# Patient Record
Sex: Female | Born: 1965 | Race: White | Hispanic: No | Marital: Married | Smoking: Never smoker
Health system: Southern US, Community
[De-identification: ages and names within clinical notes are randomized; demographics above are authoritative.]

## PROBLEM LIST (undated history)

## (undated) DIAGNOSIS — R7303 Prediabetes: Secondary | ICD-10-CM

## (undated) DIAGNOSIS — G479 Sleep disorder, unspecified: Secondary | ICD-10-CM

## (undated) DIAGNOSIS — M549 Dorsalgia, unspecified: Secondary | ICD-10-CM

## (undated) DIAGNOSIS — J45909 Unspecified asthma, uncomplicated: Secondary | ICD-10-CM

## (undated) DIAGNOSIS — R059 Cough, unspecified: Secondary | ICD-10-CM

## (undated) DIAGNOSIS — I10 Essential (primary) hypertension: Secondary | ICD-10-CM

## (undated) DIAGNOSIS — R200 Anesthesia of skin: Secondary | ICD-10-CM

## (undated) DIAGNOSIS — E538 Deficiency of other specified B group vitamins: Secondary | ICD-10-CM

## (undated) DIAGNOSIS — I011 Acute rheumatic endocarditis: Secondary | ICD-10-CM

## (undated) DIAGNOSIS — M2550 Pain in unspecified joint: Secondary | ICD-10-CM

## (undated) DIAGNOSIS — R0602 Shortness of breath: Secondary | ICD-10-CM

## (undated) DIAGNOSIS — K635 Polyp of colon: Secondary | ICD-10-CM

## (undated) DIAGNOSIS — M1990 Unspecified osteoarthritis, unspecified site: Secondary | ICD-10-CM

## (undated) DIAGNOSIS — R05 Cough: Secondary | ICD-10-CM

## (undated) HISTORY — DX: Deficiency of other specified B group vitamins: E53.8

## (undated) HISTORY — DX: Cough, unspecified: R05.9

## (undated) HISTORY — DX: Anesthesia of skin: R20.0

## (undated) HISTORY — PX: TONSILLECTOMY: SUR1361

## (undated) HISTORY — PX: OTHER SURGICAL HISTORY: SHX169

## (undated) HISTORY — PX: COLONOSCOPY: SHX174

## (undated) HISTORY — DX: Essential (primary) hypertension: I10

## (undated) HISTORY — DX: Pain in unspecified joint: M25.50

## (undated) HISTORY — DX: Prediabetes: R73.03

## (undated) HISTORY — DX: Sleep disorder, unspecified: G47.9

## (undated) HISTORY — DX: Unspecified osteoarthritis, unspecified site: M19.90

## (undated) HISTORY — DX: Cough: R05

## (undated) HISTORY — DX: Unspecified asthma, uncomplicated: J45.909

## (undated) HISTORY — PX: DILATION AND CURETTAGE OF UTERUS: SHX78

## (undated) HISTORY — DX: Dorsalgia, unspecified: M54.9

## (undated) HISTORY — DX: Shortness of breath: R06.02

## (undated) HISTORY — DX: Polyp of colon: K63.5

## (undated) HISTORY — PX: QUADRICEPS REPAIR: SHX2281

## (undated) HISTORY — DX: Acute rheumatic endocarditis: I01.1

---

## 2011-08-25 DIAGNOSIS — E559 Vitamin D deficiency, unspecified: Secondary | ICD-10-CM | POA: Insufficient documentation

## 2011-08-25 DIAGNOSIS — R05 Cough: Secondary | ICD-10-CM | POA: Insufficient documentation

## 2011-08-25 DIAGNOSIS — J45909 Unspecified asthma, uncomplicated: Secondary | ICD-10-CM | POA: Insufficient documentation

## 2012-11-11 DIAGNOSIS — Z8679 Personal history of other diseases of the circulatory system: Secondary | ICD-10-CM | POA: Insufficient documentation

## 2015-02-01 DIAGNOSIS — R0600 Dyspnea, unspecified: Secondary | ICD-10-CM | POA: Insufficient documentation

## 2016-11-16 ENCOUNTER — Ambulatory Visit: Payer: BC Managed Care – PPO | Admitting: Family

## 2016-12-07 ENCOUNTER — Encounter: Payer: Self-pay | Admitting: Family

## 2016-12-07 ENCOUNTER — Ambulatory Visit (INDEPENDENT_AMBULATORY_CARE_PROVIDER_SITE_OTHER): Payer: BC Managed Care – PPO | Admitting: Family

## 2016-12-07 VITALS — BP 138/76 | HR 68 | Temp 98.6°F | Ht 65.0 in | Wt 283.0 lb

## 2016-12-07 DIAGNOSIS — G5602 Carpal tunnel syndrome, left upper limb: Secondary | ICD-10-CM | POA: Diagnosis not present

## 2016-12-07 DIAGNOSIS — Z0000 Encounter for general adult medical examination without abnormal findings: Secondary | ICD-10-CM

## 2016-12-07 DIAGNOSIS — M1990 Unspecified osteoarthritis, unspecified site: Secondary | ICD-10-CM

## 2016-12-07 NOTE — Patient Instructions (Addendum)
Ice for arthritis  Brace for suspect carpal tunnel.  Let's treat conservatively as we discussed.   Over-the-counter medications you may try for arthritic pain include:   ThermaCare patches   Capsaicin cream   Icy hot   If conservative treatment doesn't yield results, we will consider physical therapy, consult to Sports Medicine/Orthopedics for further evaluation, and imaging.     3d Mammogram  We placed a referral. Mammogram this year. I asked that you call one the below locations and schedule this when it is convenient for you.   If you have dense breasts, you may ask for 3D mammogram over the traditional 2D mammogram as new evidence suggest 3D is superior. Please note that NOT all insurance companies cover 3D and you may have to pay a higher copay. You may call your insurance company to further clarify your benefits.   Options for [facility]  [place], [place]  * [facility] [place] [place] * Note if you ask for 3D mammogram at this location, you must request [place], [place] location*

## 2016-12-07 NOTE — Assessment & Plan Note (Signed)
Pending medical records from prior physician. Patient will follow-up for physical, Pap smear . she understands the schedule mammogram

## 2016-12-07 NOTE — Progress Notes (Signed)
Subjective:    Patient ID: Marie Solis, female    DOB: 1965-08-09, 51 y.o.   MRN: [id number]  CC: Marie Solis is a 51 y.o. female who presents today to establish care.    HPI: Came from Dr Wynetta Emery   Asthma- no longer on symbicort; needed about a year ago for cough, sob when cleaning out an old house. NO wheezing, sob, CP.   Joint pains- both fingers and hands for year, unchanged. Also notes pain in her feet and knees;  has made effort to loose weight ( lost 40 lbs), which has improved with better shoes, weight loss. NO joint swelling, fever, falls, injury.   Notes had 'pinched nerve' in neck years ago, reports left thumb, 1st and 2nd finger  Have numbness,tingling. Have painting this week which may have aggravated. Exercises, stretches, naproxen help pain  NO recent imaging of neck.   No h/o CVA, malignancy.   NO h/o ckd.           HISTORY:  Past Medical History:  Diagnosis Date  . Arthritis   . Asthma   . Colon polyps   . Hypertension    No past surgical history on file. No family history on file.  Allergies: Patient has no allergy information on record. No current outpatient prescriptions on file prior to visit.   No current facility-administered medications on file prior to visit.     Social History  Substance Use Topics  . Smoking status: Never Smoker  . Smokeless tobacco: Never Used  . Alcohol use No    Review of Systems  Constitutional: Negative for chills and fever.  Respiratory: Negative for cough.   Cardiovascular: Negative for chest pain and palpitations.  Gastrointestinal: Negative for nausea and vomiting.  Musculoskeletal: Positive for arthralgias. Negative for back pain, neck pain and neck stiffness.  Neurological: Positive for numbness.      Objective:    BP 138/76   Pulse 68   Temp 98.6 F (37 C) (Oral)   Ht 5\' 5"  (1.651 m)   Wt 283 lb (128.4 kg)   SpO2 97%   BMI 47.09 kg/m  BP Readings from Last 3 Encounters:  12/07/16  138/76   Wt Readings from Last 3 Encounters:  12/07/16 283 lb (128.4 kg)    Physical Exam  Constitutional: She appears well-developed and well-nourished.  Eyes: Conjunctivae are normal.  Neck: Normal range of motion and full passive range of motion without pain. Neck supple. No spinous process tenderness and no muscular tenderness present. No neck rigidity. Erythema present. No edema and normal range of motion present.  Negative spurlings    Cardiovascular: Normal rate, regular rhythm, normal heart sounds and normal pulses.   Pulmonary/Chest: Effort normal and breath sounds normal. She has no wheezes. She has no rhonchi. She has no rales.  Neurological: She is alert.  Grip strength normal.  Sensation intact bilateral hands.  Negative tinels, phalens  Skin: Skin is warm and dry.  Psychiatric: She has a normal mood and affect. Her speech is normal and behavior is normal. Thought content normal.  Vitals reviewed.      Assessment & Plan:   Problem List Items Addressed This Visit      Nervous and Auditory   Carpal tunnel syndrome of left wrist    Symptoms most consistent with carpal tunnel although unable to elicit on exam. Discussed conservative treatment;  advised patient to purchase carpal tunnel brace. She politely declines any referral for neurology for formal testing.  She will let me know if worsens.        Musculoskeletal and Integument   Osteoarthritis    Diffuse. Long discussion with patient about importance of exercise and how to use anti-inflammatories whether oral or topical.  Also advised her to start icing regimen. She  declined referral to orthopedics at this time and we will continue to monitor        Other   Encounter for medical examination to establish care - Primary    Pending medical records from prior physician. Patient will follow-up for physical, Pap smear . she understands the schedule mammogram      Relevant Orders   Ambulatory referral to  Dermatology   MM SCREENING BREAST TOMO BILATERAL   Ambulatory referral to Gastroenterology   Referral to Nutrition and Diabetes Services       I am having Ms. Schweers maintain her SYMBICORT.   Meds ordered this encounter  Medications  . SYMBICORT 80-4.5 MCG/ACT inhaler    Return precautions given.   Risks, benefits, and alternatives of the medications and treatment plan prescribed today were discussed, and patient expressed understanding.   Education regarding symptom management and diagnosis given to patient on AVS.  Continue to follow with Burnard Hawthorne, FNP for routine health maintenance.   Stephanie Acre and I agreed with plan.   [name], FNP

## 2016-12-07 NOTE — Progress Notes (Signed)
Pre visit review using our clinic review tool, if applicable. No additional management support is needed unless otherwise documented below in the visit note. 

## 2016-12-07 NOTE — Assessment & Plan Note (Addendum)
Symptoms most consistent with carpal tunnel although unable to elicit on exam. Discussed conservative treatment;  advised patient to purchase carpal tunnel brace. She politely declines any referral for neurology for formal testing. She will let me know if worsens.

## 2016-12-07 NOTE — Assessment & Plan Note (Addendum)
Diffuse. Long discussion with patient about importance of exercise and how to use anti-inflammatories whether oral or topical.  Also advised her to start icing regimen. She  declined referral to orthopedics at this time and we will continue to monitor

## 2016-12-16 ENCOUNTER — Encounter: Payer: Self-pay | Admitting: Family

## 2016-12-16 ENCOUNTER — Telehealth: Payer: Self-pay | Admitting: Family

## 2016-12-16 NOTE — Telephone Encounter (Signed)
Pt was notified via mail regarding mammo appt.

## 2016-12-23 ENCOUNTER — Telehealth: Payer: Self-pay

## 2016-12-23 ENCOUNTER — Other Ambulatory Visit: Payer: Self-pay

## 2016-12-23 DIAGNOSIS — Z1211 Encounter for screening for malignant neoplasm of colon: Secondary | ICD-10-CM

## 2016-12-23 NOTE — Telephone Encounter (Signed)
Gastroenterology Pre-Procedure Review  Request Date: 01/19/17 Requesting Physician: Dr. Allen Norris  PATIENT REVIEW QUESTIONS: The patient responded to the following health history questions as indicated:    1. Are you having any GI issues? no 2. Do you have a personal history of Polyps? no 3. Do you have a family history of Colon Cancer or Polyps? no 4. Diabetes Mellitus? no 5. Joint replacements in the past 12 months?no 6. Major health problems in the past 3 months?no 7. Any artificial heart valves, MVP, or defibrillator?no    MEDICATIONS & ALLERGIES:    Patient reports the following regarding taking any anticoagulation/antiplatelet therapy:   Plavix, Coumadin, Eliquis, Xarelto, Lovenox, Pradaxa, Brilinta, or Effient? no Aspirin? no  Patient confirms/reports the following medications:  Current Outpatient Prescriptions  Medication Sig Dispense Refill  . SYMBICORT 80-4.5 MCG/ACT inhaler      No current facility-administered medications for this visit.     Patient confirms/reports the following allergies:  No Known Allergies  No orders of the defined types were placed in this encounter.   AUTHORIZATION INFORMATION Primary Insurance: 1D#: Group #:  Secondary Insurance: 1D#: Group #:  SCHEDULE INFORMATION: Date: 01/19/17 Time: Location:[facility]

## 2016-12-24 ENCOUNTER — Encounter: Payer: BC Managed Care – PPO | Admitting: Family

## 2016-12-28 ENCOUNTER — Encounter: Payer: BC Managed Care – PPO | Admitting: Family

## 2016-12-29 ENCOUNTER — Encounter: Payer: Self-pay | Admitting: Family

## 2016-12-29 ENCOUNTER — Other Ambulatory Visit (HOSPITAL_COMMUNITY)
Admission: RE | Admit: 2016-12-29 | Discharge: 2016-12-29 | Disposition: A | Discharge status: Home / Self Care | Payer: BC Managed Care – PPO | Source: Ambulatory Visit | Attending: Family | Admitting: Family

## 2016-12-29 ENCOUNTER — Ambulatory Visit (INDEPENDENT_AMBULATORY_CARE_PROVIDER_SITE_OTHER): Payer: BC Managed Care – PPO | Admitting: Family

## 2016-12-29 VITALS — BP 134/86 | HR 62 | Temp 98.5°F | Ht 65.0 in | Wt 288.8 lb

## 2016-12-29 DIAGNOSIS — Z0000 Encounter for general adult medical examination without abnormal findings: Secondary | ICD-10-CM

## 2016-12-29 LAB — HM PAP SMEAR: HM PAP: NEGATIVE

## 2016-12-29 NOTE — Assessment & Plan Note (Signed)
Pending mammogram tomorrow. Advised 3-D. Pending colonoscopy. Screening labs ordered. Immunizations up-to-date. Encouraged exercise. Pap and clinical breast exam performed today.

## 2016-12-29 NOTE — Progress Notes (Signed)
Pre visit review using our clinic review tool, if applicable. No additional management support is needed unless otherwise documented below in the visit note. 

## 2016-12-29 NOTE — Progress Notes (Signed)
Subjective:    Patient ID: Marie Solis, female    DOB: [birth year], 51 y.o.   MRN: [id number]  CC: Marie Solis is a 51 y.o. female who presents today for physical exam.    HPI: No complaints. Feeling well.     Colorectal Cancer Screening: ordered Breast Cancer Screening: Mammogram scheduled Cervical Cancer Screening: perimenopausal, skipped last month, no pelvic pain. No dyspareunia; due  Bone Health screening/DEXA for 65+: No increased fracture risk. Defer screening at this time. Lung Cancer Screening: Doesn't have 30 year pack year history and age > 82 years.       Tetanus - due         HIV Screening- Candidate for , consents Labs: Screening labs today. Exercise: Gets regular exercise.  Alcohol use: none Smoking/tobacco use: Nonsmoker.  Regular dental exams: In need of dental exam, scheduled.  Wears seat belt: Yes. Skin: referred to derm at last visit, saw dermatology yesterday  HISTORY:  Past Medical History:  Diagnosis Date  . Arthritis   . Asthma   . Colon polyps   . Hypertension     Past Surgical History:  Procedure Laterality Date  . QUADRICEPS REPAIR    . TONSILLECTOMY    . tummy tuck     Family History  Problem Relation Age of Onset  . Arthritis Mother   . Heart disease Mother   . Arthritis Brother   . Heart disease Brother   . Hypertension Brother   . Cancer Brother        3 bothers melanoma      ALLERGIES: Patient has no known allergies.  Current Outpatient Prescriptions on File Prior to Visit  Medication Sig Dispense Refill  . SYMBICORT 80-4.5 MCG/ACT inhaler      No current facility-administered medications on file prior to visit.     Social History  Substance Use Topics  . Smoking status: Never Smoker  . Smokeless tobacco: Never Used  . Alcohol use No    Review of Systems  Constitutional: Negative for chills, fever and unexpected weight change.  HENT: Negative for congestion.   Respiratory: Negative for cough.     Cardiovascular: Negative for chest pain, palpitations and leg swelling.  Gastrointestinal: Negative for abdominal pain, nausea and vomiting.  Genitourinary: Negative for dyspareunia, vaginal discharge and vaginal pain.  Musculoskeletal: Negative for arthralgias and myalgias.  Skin: Negative for rash.  Neurological: Negative for headaches.  Hematological: Negative for adenopathy.  Psychiatric/Behavioral: Negative for confusion.      Objective:    BP 134/86   Pulse 62   Temp 98.5 F (36.9 C) (Oral)   Ht 5\' 5"  (1.651 m)   Wt 288 lb 12.8 oz (131 kg)   SpO2 97%   BMI 48.06 kg/m   BP Readings from Last 3 Encounters:  12/29/16 134/86  12/07/16 138/76   Wt Readings from Last 3 Encounters:  12/29/16 288 lb 12.8 oz (131 kg)  12/07/16 283 lb (128.4 kg)    Physical Exam  Constitutional: She appears well-developed and well-nourished.  Eyes: Conjunctivae are normal.  Neck: No thyroid mass and no thyromegaly present.  Cardiovascular: Normal rate, regular rhythm, normal heart sounds and normal pulses.   Pulmonary/Chest: Effort normal and breath sounds normal. She has no wheezes. She has no rhonchi. She has no rales. Right breast exhibits no inverted nipple, no mass, no nipple discharge, no skin change and no tenderness. Left breast exhibits no inverted nipple, no mass, no nipple discharge, no skin change and  no tenderness. Breasts are symmetrical.  No masses or asymmetry appreciated during CBE.  Genitourinary: Uterus is not enlarged, not fixed and not tender. Cervix exhibits no motion tenderness, no discharge and no friability. Right adnexum displays no mass, no tenderness and no fullness. Left adnexum displays no mass, no tenderness and no fullness.  Genitourinary Comments: Pap performed. No CMT. Unable to appreciated ovaries.  Lymphadenopathy:       Head (right side): No submental, no submandibular, no tonsillar, no preauricular, no posterior auricular and no occipital adenopathy  present.       Head (left side): No submental, no submandibular, no tonsillar, no preauricular, no posterior auricular and no occipital adenopathy present.       Right cervical: No superficial cervical, no deep cervical and no posterior cervical adenopathy present.      Left cervical: No superficial cervical, no deep cervical and no posterior cervical adenopathy present.    She has no axillary adenopathy.       Right axillary: No pectoral and no lateral adenopathy present.       Left axillary: No pectoral and no lateral adenopathy present. Neurological: She is alert.  Skin: Skin is warm and dry.  Psychiatric: She has a normal mood and affect. Her speech is normal and behavior is normal. Thought content normal.  Vitals reviewed.      Assessment & Plan:   Problem List Items Addressed This Visit      Other   Routine physical examination - Primary    Pending mammogram tomorrow. Advised 3-D. Pending colonoscopy. Screening labs ordered. Immunizations up-to-date. Encouraged exercise. Pap and clinical breast exam performed today.      Relevant Orders   CBC with Differential/Platelet   Comprehensive metabolic panel   Hemoglobin A1c   HIV antibody   Lipid panel   VITAMIN D 25 Hydroxy (Vit-D Deficiency, Fractures)   TSH   Cytology - PAP       I am having Marie Solis maintain her SYMBICORT.   No orders of the defined types were placed in this encounter.   Return precautions given.   Risks, benefits, and alternatives of the medications and treatment plan prescribed today were discussed, and patient expressed understanding.   Education regarding symptom management and diagnosis given to patient on AVS.   Continue to follow with Burnard Hawthorne, FNP for routine health maintenance.   Stephanie Acre and I agreed with plan.   [name], FNP

## 2016-12-29 NOTE — Patient Instructions (Signed)
Fasting labs when you can   Ensure 3d mammogram tomorrow   Health Maintenance, Female Adopting a healthy lifestyle and getting preventive care can go a long way to promote health and wellness. Talk with your health care provider about what schedule of regular examinations is right for you. This is a good chance for you to check in with your provider about disease prevention and staying healthy. In between checkups, there are plenty of things you can do on your own. Experts have done a lot of research about which lifestyle changes and preventive measures are most likely to keep you healthy. Ask your health care provider for more information. Weight and diet Eat a healthy diet  Be sure to include plenty of vegetables, fruits, low-fat dairy products, and lean protein.  Do not eat a lot of foods high in solid fats, added sugars, or salt.  Get regular exercise. This is one of the most important things you can do for your health. ? Most adults should exercise for at least 150 minutes each week. The exercise should increase your heart rate and make you sweat (moderate-intensity exercise). ? Most adults should also do strengthening exercises at least twice a week. This is in addition to the moderate-intensity exercise.  Maintain a healthy weight  Body mass index (BMI) is a measurement that can be used to identify possible weight problems. It estimates body fat based on height and weight. Your health care provider can help determine your BMI and help you achieve or maintain a healthy weight.  For females 46 years of age and older: ? A BMI below 18.5 is considered underweight. ? A BMI of 18.5 to 24.9 is normal. ? A BMI of 25 to 29.9 is considered overweight. ? A BMI of 30 and above is considered obese.  Watch levels of cholesterol and blood lipids  You should start having your blood tested for lipids and cholesterol at 52 years of age, then have this test every 5 years.  You may need to have  your cholesterol levels checked more often if: ? Your lipid or cholesterol levels are high. ? You are older than 52 years of age. ? You are at high risk for heart disease.  Cancer screening Lung Cancer  Lung cancer screening is recommended for adults 78-51 years old who are at high risk for lung cancer because of a history of smoking.  A yearly low-dose CT scan of the lungs is recommended for people who: ? Currently smoke. ? Have quit within the past 15 years. ? Have at least a 30-pack-year history of smoking. A pack year is smoking an average of one pack of cigarettes a day for 1 year.  Yearly screening should continue until it has been 15 years since you quit.  Yearly screening should stop if you develop a health problem that would prevent you from having lung cancer treatment.  Breast Cancer  Practice breast self-awareness. This means understanding how your breasts normally appear and feel.  It also means doing regular breast self-exams. Let your health care provider know about any changes, no matter how small.  If you are in your 20s or 30s, you should have a clinical breast exam (CBE) by a health care provider every 1-3 years as part of a regular health exam.  If you are 51 or older, have a CBE every year. Also consider having a breast X-ray (mammogram) every year.  If you have a family history of breast cancer, talk to your  health care provider about genetic screening.  If you are at high risk for breast cancer, talk to your health care provider about having an MRI and a mammogram every year.  Breast cancer gene (BRCA) assessment is recommended for women who have family members with BRCA-related cancers. BRCA-related cancers include: ? Breast. ? Ovarian. ? Tubal. ? Peritoneal cancers.  Results of the assessment will determine the need for genetic counseling and BRCA1 and BRCA2 testing.  Cervical Cancer Your health care provider may recommend that you be screened  regularly for cancer of the pelvic organs (ovaries, uterus, and vagina). This screening involves a pelvic examination, including checking for microscopic changes to the surface of your cervix (Pap test). You may be encouraged to have this screening done every 3 years, beginning at age 70.  For women ages 66-65, health care providers may recommend pelvic exams and Pap testing every 3 years, or they may recommend the Pap and pelvic exam, combined with testing for human papilloma virus (HPV), every 5 years. Some types of HPV increase your risk of cervical cancer. Testing for HPV may also be done on women of any age with unclear Pap test results.  Other health care providers may not recommend any screening for nonpregnant women who are considered low risk for pelvic cancer and who do not have symptoms. Ask your health care provider if a screening pelvic exam is right for you.  If you have had past treatment for cervical cancer or a condition that could lead to cancer, you need Pap tests and screening for cancer for at least 20 years after your treatment. If Pap tests have been discontinued, your risk factors (such as having a new sexual partner) need to be reassessed to determine if screening should resume. Some women have medical problems that increase the chance of getting cervical cancer. In these cases, your health care provider may recommend more frequent screening and Pap tests.  Colorectal Cancer  This type of cancer can be detected and often prevented.  Routine colorectal cancer screening usually begins at 52 years of age and continues through 52 years of age.  Your health care provider may recommend screening at an earlier age if you have risk factors for colon cancer.  Your health care provider may also recommend using home test kits to check for hidden blood in the stool.  A small camera at the end of a tube can be used to examine your colon directly (sigmoidoscopy or colonoscopy). This is  done to check for the earliest forms of colorectal cancer.  Routine screening usually begins at age 4.  Direct examination of the colon should be repeated every 5-10 years through 52 years of age. However, you may need to be screened more often if early forms of precancerous polyps or small growths are found.  Skin Cancer  Check your skin from head to toe regularly.  Tell your health care provider about any new moles or changes in moles, especially if there is a change in a mole's shape or color.  Also tell your health care provider if you have a mole that is larger than the size of a pencil eraser.  Always use sunscreen. Apply sunscreen liberally and repeatedly throughout the day.  Protect yourself by wearing long sleeves, pants, a wide-brimmed hat, and sunglasses whenever you are outside.  Heart disease, diabetes, and high blood pressure  High blood pressure causes heart disease and increases the risk of stroke. High blood pressure is more likely to  develop in: ? People who have blood pressure in the high end of the normal range (130-139/85-89 mm Hg). ? People who are overweight or obese. ? People who are African American.  If you are 93-29 years of age, have your blood pressure checked every 3-5 years. If you are 29 years of age or older, have your blood pressure checked every year. You should have your blood pressure measured twice-once when you are at a hospital or clinic, and once when you are not at a hospital or clinic. Record the average of the two measurements. To check your blood pressure when you are not at a hospital or clinic, you can use: ? An automated blood pressure machine at a pharmacy. ? A home blood pressure monitor.  If you are between 75 years and 4 years old, ask your health care provider if you should take aspirin to prevent strokes.  Have regular diabetes screenings. This involves taking a blood sample to check your fasting blood sugar level. ? If you are  at a normal weight and have a low risk for diabetes, have this test once every three years after 52 years of age. ? If you are overweight and have a high risk for diabetes, consider being tested at a younger age or more often. Preventing infection Hepatitis B  If you have a higher risk for hepatitis B, you should be screened for this virus. You are considered at high risk for hepatitis B if: ? You were born in a country where hepatitis B is common. Ask your health care provider which countries are considered high risk. ? Your parents were born in a high-risk country, and you have not been immunized against hepatitis B (hepatitis B vaccine). ? You have HIV or AIDS. ? You use needles to inject street drugs. ? You live with someone who has hepatitis B. ? You have had sex with someone who has hepatitis B. ? You get hemodialysis treatment. ? You take certain medicines for conditions, including cancer, organ transplantation, and autoimmune conditions.  Hepatitis C  Blood testing is recommended for: ? Everyone born from [birth year] through 1966. ? Anyone with known risk factors for hepatitis C.  Sexually transmitted infections (STIs)  You should be screened for sexually transmitted infections (STIs) including gonorrhea and chlamydia if: ? You are sexually active and are younger than 52 years of age. ? You are older than 52 years of age and your health care provider tells you that you are at risk for this type of infection. ? Your sexual activity has changed since you were last screened and you are at an increased risk for chlamydia or gonorrhea. Ask your health care provider if you are at risk.  If you do not have HIV, but are at risk, it may be recommended that you take a prescription medicine daily to prevent HIV infection. This is called pre-exposure prophylaxis (PrEP). You are considered at risk if: ? You are sexually active and do not regularly use condoms or know the HIV status of your  partner(s). ? You take drugs by injection. ? You are sexually active with a partner who has HIV.  Talk with your health care provider about whether you are at high risk of being infected with HIV. If you choose to begin PrEP, you should first be tested for HIV. You should then be tested every 3 months for as long as you are taking PrEP. Pregnancy  If you are premenopausal and you may become pregnant,  ask your health care provider about preconception counseling.  If you may become pregnant, take 400 to 800 micrograms (mcg) of folic acid every day.  If you want to prevent pregnancy, talk to your health care provider about birth control (contraception). Osteoporosis and menopause  Osteoporosis is a disease in which the bones lose minerals and strength with aging. This can result in serious bone fractures. Your risk for osteoporosis can be identified using a bone density scan.  If you are 42 years of age or older, or if you are at risk for osteoporosis and fractures, ask your health care provider if you should be screened.  Ask your health care provider whether you should take a calcium or vitamin D supplement to lower your risk for osteoporosis.  Menopause may have certain physical symptoms and risks.  Hormone replacement therapy may reduce some of these symptoms and risks. Talk to your health care provider about whether hormone replacement therapy is right for you. Follow these instructions at home:  Schedule regular health, dental, and eye exams.  Stay current with your immunizations.  Do not use any tobacco products including cigarettes, chewing tobacco, or electronic cigarettes.  If you are pregnant, do not drink alcohol.  If you are breastfeeding, limit how much and how often you drink alcohol.  Limit alcohol intake to no more than 1 drink per day for nonpregnant women. One drink equals 12 ounces of beer, 5 ounces of wine, or 1 ounces of hard liquor.  Do not use street  drugs.  Do not share needles.  Ask your health care provider for help if you need support or information about quitting drugs.  Tell your health care provider if you often feel depressed.  Tell your health care provider if you have ever been abused or do not feel safe at home. This information is not intended to replace advice given to you by your health care provider. Make sure you discuss any questions you have with your health care provider. Document Released: 11/24/2010 Document Revised: 10/17/2015 Document Reviewed: 02/12/2015 Elsevier Interactive Patient Education  [employer].

## 2016-12-30 ENCOUNTER — Ambulatory Visit
Admission: RE | Admit: 2016-12-30 | Discharge: 2016-12-30 | Disposition: A | Discharge status: Home / Self Care | Payer: BC Managed Care – PPO | Source: Ambulatory Visit | Attending: Family | Admitting: Family

## 2016-12-30 DIAGNOSIS — Z0000 Encounter for general adult medical examination without abnormal findings: Secondary | ICD-10-CM

## 2016-12-30 DIAGNOSIS — Z1231 Encounter for screening mammogram for malignant neoplasm of breast: Secondary | ICD-10-CM | POA: Insufficient documentation

## 2016-12-31 ENCOUNTER — Telehealth: Payer: Self-pay

## 2016-12-31 LAB — CYTOLOGY - PAP
Adequacy: ABSENT
Diagnosis: NEGATIVE
HPV: NOT DETECTED

## 2016-12-31 NOTE — Telephone Encounter (Signed)
Contacted pt to schedule colonoscopy but she was already scheduled.  She requested a sooner date therefore her colonoscopy has been moved to 01/07/17 Thursday with Dr Marius Ditch at [facility].

## 2016-12-31 NOTE — Progress Notes (Signed)
Pap has been abstracted

## 2017-01-07 ENCOUNTER — Encounter
Admission: RE | Disposition: A | Discharge status: Home / Self Care | Payer: Self-pay | Source: Ambulatory Visit | Attending: Gastroenterology

## 2017-01-07 ENCOUNTER — Ambulatory Visit: Payer: BC Managed Care – PPO | Admitting: Anesthesiology

## 2017-01-07 ENCOUNTER — Ambulatory Visit
Admission: RE | Admit: 2017-01-07 | Discharge: 2017-01-07 | Disposition: A | Discharge status: Home / Self Care | Payer: BC Managed Care – PPO | Source: Ambulatory Visit | Attending: Gastroenterology | Admitting: Gastroenterology

## 2017-01-07 ENCOUNTER — Encounter: Payer: Self-pay | Admitting: *Deleted

## 2017-01-07 DIAGNOSIS — Z1211 Encounter for screening for malignant neoplasm of colon: Secondary | ICD-10-CM | POA: Diagnosis present

## 2017-01-07 DIAGNOSIS — J45909 Unspecified asthma, uncomplicated: Secondary | ICD-10-CM | POA: Insufficient documentation

## 2017-01-07 DIAGNOSIS — Z8249 Family history of ischemic heart disease and other diseases of the circulatory system: Secondary | ICD-10-CM | POA: Diagnosis not present

## 2017-01-07 DIAGNOSIS — E669 Obesity, unspecified: Secondary | ICD-10-CM | POA: Insufficient documentation

## 2017-01-07 DIAGNOSIS — D123 Benign neoplasm of transverse colon: Secondary | ICD-10-CM | POA: Insufficient documentation

## 2017-01-07 DIAGNOSIS — M1990 Unspecified osteoarthritis, unspecified site: Secondary | ICD-10-CM | POA: Diagnosis not present

## 2017-01-07 DIAGNOSIS — Z79899 Other long term (current) drug therapy: Secondary | ICD-10-CM | POA: Insufficient documentation

## 2017-01-07 DIAGNOSIS — Z86010 Personal history of colonic polyps: Secondary | ICD-10-CM | POA: Insufficient documentation

## 2017-01-07 DIAGNOSIS — Z6841 Body Mass Index (BMI) 40.0 and over, adult: Secondary | ICD-10-CM | POA: Insufficient documentation

## 2017-01-07 DIAGNOSIS — I10 Essential (primary) hypertension: Secondary | ICD-10-CM | POA: Insufficient documentation

## 2017-01-07 HISTORY — PX: COLONOSCOPY WITH PROPOFOL: SHX5780

## 2017-01-07 SURGERY — COLONOSCOPY WITH PROPOFOL
Anesthesia: General

## 2017-01-07 MED ORDER — PROPOFOL 500 MG/50ML IV EMUL
INTRAVENOUS | Status: DC | PRN
  Administered 2017-01-07: 120 ug/kg/min via INTRAVENOUS

## 2017-01-07 MED ORDER — MIDAZOLAM HCL 2 MG/2ML IJ SOLN
INTRAMUSCULAR | Status: AC
  Filled 2017-01-07: qty 2

## 2017-01-07 MED ORDER — FENTANYL CITRATE (PF) 100 MCG/2ML IJ SOLN
INTRAMUSCULAR | Status: AC
  Filled 2017-01-07: qty 2

## 2017-01-07 MED ORDER — MIDAZOLAM HCL 2 MG/2ML IJ SOLN
INTRAMUSCULAR | Status: DC | PRN
  Administered 2017-01-07 (×2): 1 mg via INTRAVENOUS

## 2017-01-07 MED ORDER — PROPOFOL 10 MG/ML IV BOLUS
INTRAVENOUS | Status: AC
  Filled 2017-01-07: qty 20

## 2017-01-07 MED ORDER — FENTANYL CITRATE (PF) 100 MCG/2ML IJ SOLN
INTRAMUSCULAR | Status: DC | PRN
  Administered 2017-01-07 (×2): 50 ug via INTRAVENOUS

## 2017-01-07 MED ORDER — LIDOCAINE HCL (PF) 2 % IJ SOLN
INTRAMUSCULAR | Status: AC
  Filled 2017-01-07: qty 2

## 2017-01-07 MED ORDER — PROPOFOL 10 MG/ML IV BOLUS
INTRAVENOUS | Status: DC | PRN
  Administered 2017-01-07: 30 mg via INTRAVENOUS
  Administered 2017-01-07: 20 mg via INTRAVENOUS

## 2017-01-07 MED ORDER — LIDOCAINE HCL (CARDIAC) 20 MG/ML IV SOLN
INTRAVENOUS | Status: DC | PRN
  Administered 2017-01-07: 2 mL via INTRAVENOUS

## 2017-01-07 MED ORDER — SODIUM CHLORIDE 0.9 % IV SOLN
INTRAVENOUS | Status: DC
  Administered 2017-01-07: 09:00:00 via INTRAVENOUS

## 2017-01-07 NOTE — Transfer of Care (Signed)
Immediate Anesthesia Transfer of Care Note  Patient: Marie Solis  Procedure(s) Performed: Procedure(s): COLONOSCOPY WITH PROPOFOL (N/A)  Patient Location: PACU  Anesthesia Type:General  Level of Consciousness: awake and alert   Airway & Oxygen Therapy: Patient Spontanous Breathing and Patient connected to nasal cannula oxygen  Post-op Assessment: Report given to RN and Post -op Vital signs reviewed and stable  Post vital signs: Reviewed  Last Vitals:  Vitals:   01/07/17 0852  BP: 131/79  Pulse: (!) 31  Resp: 20  Temp: (!) 36.2 C  SpO2: 98%    Last Pain: There were no vitals filed for this visit.       Complications: No apparent anesthesia complications

## 2017-01-07 NOTE — H&P (Signed)
  Marie Darby, MD [address]  [place]  [place], [place] [address]  Main: [phone]  Fax: [phone] Pager: [phone]  Primary Care Physician:  Burnard Hawthorne, FNP Primary Gastroenterologist:  Dr. Cephas Solis  Pre-Procedure History & Physical: HPI:  Marie Solis is a 51 y.o. female is here for an colonoscopy.   Past Medical History:  Diagnosis Date  . Arthritis   . Asthma   . Colon polyps   . Hypertension     Past Surgical History:  Procedure Laterality Date  . COLONOSCOPY    . QUADRICEPS REPAIR    . TONSILLECTOMY    . tummy tuck      Prior to Admission medications   Medication Sig Start Date End Date Taking? Authorizing Provider  Multiple Vitamin (MULTIVITAMIN) tablet Take 1 tablet by mouth daily.   Yes [provider]  SYMBICORT 80-4.5 MCG/ACT inhaler  09/29/16   [provider]    Allergies as of 12/23/2016  . (No Known Allergies)    Family History  Problem Relation Age of Onset  . Arthritis Mother   . Heart disease Mother   . Arthritis Brother   . Heart disease Brother   . Hypertension Brother   . Cancer Brother        3 bothers melanoma  . Breast cancer Neg Hx     Social History   Social History  . Marital status: Married    Spouse name: N/A  . Number of children: N/A  . Years of education: N/A   Occupational History  . Not on file.   Social History Main Topics  . Smoking status: Never Smoker  . Smokeless tobacco: Never Used  . Alcohol use No  . Drug use: No  . Sexual activity: Not on file   Other Topics Concern  . 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 Neck:  Supple; no masses or thyromegaly. Lungs:  Clear throughout to auscultation.    Heart:  Regular rate and rhythm. Abdomen:  Soft, nontender and nondistended. Normal bowel sounds, without guarding, and without rebound.   Neurologic:  Alert and  oriented x4;  grossly normal neurologically.  Impression/Plan: Marie Solis is here for an colonoscopy to be performed for colon cancer screening  Risks, benefits, limitations, and alternatives regarding  colonoscopy have been reviewed with the patient.  Questions have been answered.  All parties agreeable.   Sherri Sear, MD  01/07/2017, 9:22 AM

## 2017-01-07 NOTE — Anesthesia Post-op Follow-up Note (Signed)
Anesthesia QCDR form completed.        

## 2017-01-07 NOTE — Op Note (Signed)
[facility]astroenterology Patient Name: Marie Solis Procedure Date: 01/07/2017 9:40 AM MRN: [id number] Account #: [id number] Date of Birth: [birth year] Admit Type: Outpatient Age: 51 Room: [facility] ENDO ROOM 4 Gender: Female Note Status: Finalized Procedure:            Colonoscopy Indications:          Screening for colorectal malignant neoplasm Providers:            Lin Landsman MD, MD Referring MD:         Yvetta Coder. Arnett (Referring MD) Medicines:            Monitored Anesthesia Care Complications:        No immediate complications. Estimated blood loss:                        Minimal. Procedure:            Pre-Anesthesia Assessment:                       - Prior to the procedure, a History and Physical was                        performed, and patient medications and allergies were                        reviewed. The patient is competent. The risks and                        benefits of the procedure and the sedation options and                        risks were discussed with the patient. All questions                        were answered and informed consent was obtained.                        Patient identification and proposed procedure were                        verified by the physician, the nurse, the                        anesthesiologist, the anesthetist and the technician in                        the pre-procedure area in the procedure room. Mental                        Status Examination: alert and oriented. Airway                        Examination: normal oropharyngeal airway and neck                        mobility. Respiratory Examination: clear to                        auscultation. CV Examination: normal. Prophylactic  Antibiotics: The patient does not require prophylactic                        antibiotics. Prior Anticoagulants: The patient has                        taken no previous anticoagulant or  antiplatelet agents.                        ASA Grade Assessment: I - A normal, healthy patient.                        After reviewing the risks and benefits, the patient was                        deemed in satisfactory condition to undergo the                        procedure. The anesthesia plan was to use monitored                        anesthesia care (MAC). Immediately prior to                        administration of medications, the patient was                        re-assessed for adequacy to receive sedatives. The                        heart rate, respiratory rate, oxygen saturations, blood                        pressure, adequacy of pulmonary ventilation, and                        response to care were monitored throughout the                        procedure. The physical status of the patient was                        re-assessed after the procedure.                       After obtaining informed consent, the colonoscope was                        passed under direct vision. Throughout the procedure,                        the patient's blood pressure, pulse, and oxygen                        saturations were monitored continuously. The                        Colonoscope was introduced through the anus and                        advanced to the  the cecum, identified by appendiceal                        orifice and ileocecal valve. The colonoscopy was                        performed without difficulty. The patient tolerated the                        procedure well. The quality of the bowel preparation                        was evaluated using the BBPS [facility] Bowel Preparation                        Scale) with scores of: Right Colon = 3, Transverse                        Colon = 3 and Left Colon = 3 (entire mucosa seen well                        with no residual staining, small fragments of stool or                        opaque liquid). The total BBPS score equals  9. Findings:      The perianal and digital rectal examinations were normal. Pertinent       negatives include normal sphincter tone and no palpable rectal lesions.      A diminutive polyp was found in the cecum. The polyp was sessile. The       polyp was removed with a cold biopsy forceps. Resection and retrieval       were complete.      A 5 mm polyp was found in the transverse colon. The polyp was sessile.       The polyp was removed with a cold snare. Resection and retrieval were       complete.      A diffuse area of scattered areas of polypoid appearing mucosa was found       in the descending colon and in the transverse colon. Biopsies were taken       with a cold forceps for histology to rule out polypsis.      The retroflexed view of the distal rectum and anal verge was normal and       showed no anal or rectal abnormalities. Impression:           - One diminutive polyp in the cecum, removed with a                        cold biopsy forceps. Resected and retrieved.                       - One 5 mm polyp in the transverse colon, removed with                        a cold snare. Resected and retrieved.                       - Scattered areas of polypoid appearing mucosa in the  descending colon and in the transverse colon. Biopsied.                       - The distal rectum and anal verge are normal on                        retroflexion view. Recommendation:       - Discharge patient to home.                       - Resume regular diet today.                       - Await pathology results.                       - Repeat colonoscopy in 5 years or sooner for                        surveillance based on pathology results.                       - Continue present medications. Procedure Code(s):    --- Professional ---                       [phone], Colonoscopy, flexible; with removal of tumor(s),                        polyp(s), or other lesion(s) by snare  technique                       45380, 67, Colonoscopy, flexible; with biopsy, single                        or multiple Diagnosis Code(s):    --- Professional ---                       Z12.11, Encounter for screening for malignant neoplasm                        of colon                       D12.0, Benign neoplasm of cecum                       D12.3, Benign neoplasm of transverse colon (hepatic                        flexure or splenic flexure) CPT copyright 2016 American Medical Association. All rights reserved. The codes documented in this report are preliminary and upon coder review may  be revised to meet current compliance requirements. Dr. Ulyess Mort Lin Landsman MD, MD 01/07/2017 10:23:09 AM This report has been signed electronically. Number of Addenda: 0 Note Initiated On: 01/07/2017 9:40 AM Scope Withdrawal Time: 0 hours 19 minutes 5 seconds  Total Procedure Duration: 0 hours 21 minutes 49 seconds       [facility]

## 2017-01-07 NOTE — Anesthesia Preprocedure Evaluation (Signed)
Anesthesia Evaluation  Patient identified by MRN, date of birth, ID band Patient awake    Reviewed: Allergy & Precautions, NPO status , reviewed documented beta blocker date and time   Airway Mallampati: II       Dental  (+) Teeth Intact   Pulmonary    breath sounds clear to auscultation       Cardiovascular hypertension,  Rhythm:Regular Rate:Normal     Neuro/Psych negative psych ROS   GI/Hepatic negative GI ROS, Neg liver ROS,   Endo/Other  negative endocrine ROS  Renal/GU negative Renal ROS     Musculoskeletal negative musculoskeletal ROS (+)   Abdominal (+) + obese,   Peds negative pediatric ROS (+)  Hematology negative hematology ROS (+)   Anesthesia Other Findings   Reproductive/Obstetrics                             Anesthesia Physical Anesthesia Plan  ASA: II  Anesthesia Plan: General   Post-op Pain Management:    Induction: Intravenous  PONV Risk Score and Plan:   Airway Management Planned: Natural Airway and Nasal Cannula  Additional Equipment:   Intra-op Plan:   Post-operative Plan:   Informed Consent: I have reviewed the patients History and Physical, chart, labs and discussed the procedure including the risks, benefits and alternatives for the proposed anesthesia with the patient or authorized representative who has indicated his/her understanding and acceptance.     Plan Discussed with: Surgeon  Anesthesia Plan Comments:         Anesthesia Quick Evaluation

## 2017-01-08 LAB — SURGICAL PATHOLOGY

## 2017-01-11 ENCOUNTER — Encounter: Payer: Self-pay | Admitting: Gastroenterology

## 2017-01-12 ENCOUNTER — Other Ambulatory Visit: Payer: Self-pay

## 2017-01-12 ENCOUNTER — Other Ambulatory Visit: Payer: BC Managed Care – PPO

## 2017-01-15 ENCOUNTER — Other Ambulatory Visit: Payer: BC Managed Care – PPO

## 2017-02-02 ENCOUNTER — Other Ambulatory Visit: Payer: BC Managed Care – PPO

## 2017-02-04 ENCOUNTER — Other Ambulatory Visit (INDEPENDENT_AMBULATORY_CARE_PROVIDER_SITE_OTHER): Payer: BC Managed Care – PPO

## 2017-02-04 DIAGNOSIS — Z0000 Encounter for general adult medical examination without abnormal findings: Secondary | ICD-10-CM | POA: Diagnosis not present

## 2017-02-04 LAB — CBC WITH DIFFERENTIAL/PLATELET
BASOS ABS: 0 10*3/uL (ref 0.0–0.1)
Basophils Relative: 1 % (ref 0.0–3.0)
Eosinophils Absolute: 0.4 10*3/uL (ref 0.0–0.7)
Eosinophils Relative: 8.8 % — ABNORMAL HIGH (ref 0.0–5.0)
HCT: 37.6 % (ref 36.0–46.0)
HEMOGLOBIN: 12 g/dL (ref 12.0–15.0)
LYMPHS ABS: 1.5 10*3/uL (ref 0.7–4.0)
Lymphocytes Relative: 31.7 % (ref 12.0–46.0)
MCHC: 32.1 g/dL (ref 30.0–36.0)
MCV: 86.4 fl (ref 78.0–100.0)
MONO ABS: 0.5 10*3/uL (ref 0.1–1.0)
MONOS PCT: 9.9 % (ref 3.0–12.0)
NEUTROS PCT: 48.6 % (ref 43.0–77.0)
Neutro Abs: 2.3 10*3/uL (ref 1.4–7.7)
Platelets: 254 10*3/uL (ref 150.0–400.0)
RBC: 4.35 Mil/uL (ref 3.87–5.11)
RDW: 15.2 % (ref 11.5–15.5)
WBC: 4.8 10*3/uL (ref 4.0–10.5)

## 2017-02-04 LAB — LIPID PANEL
CHOL/HDL RATIO: 3
Cholesterol: 163 mg/dL (ref 0–200)
HDL: 49.4 mg/dL (ref >39.00)
LDL Cholesterol: 89 mg/dL (ref 0–99)
NONHDL: 113.17
Triglycerides: 119 mg/dL (ref 0.0–149.0)
VLDL: 23.8 mg/dL (ref 0.0–40.0)

## 2017-02-04 LAB — COMPREHENSIVE METABOLIC PANEL
ALK PHOS: 65 U/L (ref 39–117)
ALT: 11 U/L (ref 0–35)
AST: 15 U/L (ref 0–37)
Albumin: 4 g/dL (ref 3.5–5.2)
BUN: 16 mg/dL (ref 6–23)
CO2: 24 mEq/L (ref 19–32)
Calcium: 9.4 mg/dL (ref 8.4–10.5)
Chloride: 106 mEq/L (ref 96–112)
Creatinine, Ser: 0.78 mg/dL (ref 0.40–1.20)
GFR: 82.39 mL/min (ref >60.00)
GLUCOSE: 93 mg/dL (ref 70–99)
Potassium: 4 mEq/L (ref 3.5–5.1)
SODIUM: 137 meq/L (ref 135–145)
TOTAL PROTEIN: 6.8 g/dL (ref 6.0–8.3)
Total Bilirubin: 0.7 mg/dL (ref 0.2–1.2)

## 2017-02-04 LAB — HEMOGLOBIN A1C: Hgb A1c MFr Bld: 5.3 % (ref 4.6–6.5)

## 2017-02-04 LAB — VITAMIN D 25 HYDROXY (VIT D DEFICIENCY, FRACTURES): VITD: 21.1 ng/mL — AB (ref 30.00–100.00)

## 2017-02-04 LAB — TSH: TSH: 2.51 u[IU]/mL (ref 0.35–4.50)

## 2017-02-04 NOTE — Addendum Note (Signed)
Addended by: Arby Barrette on: 02/04/2017 08:33 AM   Modules accepted: Orders

## 2017-02-18 ENCOUNTER — Encounter: Payer: Self-pay | Admitting: Family

## 2017-02-18 ENCOUNTER — Ambulatory Visit (INDEPENDENT_AMBULATORY_CARE_PROVIDER_SITE_OTHER): Payer: BC Managed Care – PPO | Admitting: Family

## 2017-02-18 VITALS — BP 150/90 | HR 64 | Temp 97.6°F | Ht 65.0 in | Wt 279.8 lb

## 2017-02-18 DIAGNOSIS — M79672 Pain in left foot: Secondary | ICD-10-CM | POA: Diagnosis not present

## 2017-02-18 DIAGNOSIS — M79671 Pain in right foot: Secondary | ICD-10-CM

## 2017-02-18 LAB — B12 AND FOLATE PANEL
Folate: >23.9 ng/mL (ref >5.9)
Vitamin B-12: 271 pg/mL (ref 211–911)

## 2017-02-18 MED ORDER — GABAPENTIN 100 MG PO CAPS: 200.0000 mg | ORAL_CAPSULE | Freq: Every day | ORAL | 3 refills | Status: DC

## 2017-02-18 NOTE — Patient Instructions (Signed)
Labs   For suspected nerve involvement,May trial gabapentin at bedtime.   Frozen bottle for suspect plantar fascitis  Ibuprofen  Let me know if not better    Heel Spur A heel spur is a bony growth that forms on the bottom of your heel bone (calcaneus). Heel spurs are common and do not always cause pain. However, heel spurs often cause inflammation in the strong band of tissue that runs underneath the bone of your foot (plantar fascia). When this happens, you may feel pain on the bottom of your foot, near your heel. What are the causes? The cause of heel spurs is not completely understood. They may be caused by pressure on the heel. Or, they may stem from the muscle attachments (tendons) near the spur pulling on the heel. What increases the risk? You may be at risk for a heel spur if you:  Are older than 40.  Are overweight.  Have wear and tear arthritis (osteoarthritis).  Have plantar fascia inflammation.  What are the signs or symptoms? Some people have heel spurs but no symptoms. If you do have symptoms, they may include:  Pain in the bottom of your heel.  Pain that is worse when you first get out of bed.  Pain that gets worse after walking or standing.  How is this diagnosed? Your health care provider may diagnose a heel spur based on your symptoms and a physical exam. You may also have an X-ray of your foot to check for a bony growth coming from the calcaneus. How is this treated? Treatment aims to relieve the pain from the heel spur. This may include:  Stretching exercises.  Losing weight.  Wearing specific shoes, inserts, or orthotics for comfort and support.  Wearing splints at night to properly position your feet.  Taking over-the-counter medicine to relieve pain.  Being treated with high-intensity sound waves to break up the heel spur (extracorporeal shock wave therapy).  Getting steroid injections in your heel to reduce swelling and ease pain.  Having  surgery if your heel spur causes long-term (chronic) pain.  Follow these instructions at home:  Take medicines only as directed by your health care provider.  Ask your health care provider if you should use ice or cold packs on the painful areas of your heel or foot.  Avoid activities that cause you pain until you recover or as directed by your health care provider.  Stretch before exercising or being physically active.  Wear supportive shoes that fit well as directed by your health care provider. You might need to buy new shoes. Wearing old shoes or shoes that do not fit correctly may not provide the support that you need.  Lose weight if your health care provider thinks you should. This can relieve pressure on your foot that may be causing pain and discomfort. Contact a health care provider if:  Your pain continues or gets worse. This information is not intended to replace advice given to you by your health care provider. Make sure you discuss any questions you have with your health care provider. Document Released: 06/17/2005 Document Revised: 10/17/2015 Document Reviewed: 07/12/2013 Elsevier Interactive Patient Education  [employer].

## 2017-02-18 NOTE — Progress Notes (Signed)
Subjective:    Patient ID: Marie Solis, female    DOB: [birth year], 51 y.o.   MRN: [id number]  CC: Marie Solis is a 51 y.o. female who presents today for an acute visit.    HPI: Left foot pain has been happened.  At end of day, stabbing pain in middle left side aspect of foot; even wakes up in middle og night. Tingling. Episode wll last one hour and then resovle on its own. No tingling, numbness, cramping. No pain when gets up in morning with feet. No swelling.  Changed shoes to more supportive shoes-which has helped particularly with foot pain after work.Walks a lot with work and also trying to walk more.   Sometimes right bothers her.   H/o bone spurs    HISTORY:  Past Medical History:  Diagnosis Date  . Arthritis   . Asthma   . Colon polyps   . Hypertension    Past Surgical History:  Procedure Laterality Date  . COLONOSCOPY    . COLONOSCOPY WITH PROPOFOL N/A 01/07/2017   Procedure: COLONOSCOPY WITH PROPOFOL;  Surgeon: Lin Landsman, MD;  Location: [facility] ENDOSCOPY;  Service: Endoscopy;  Laterality: N/A;  . QUADRICEPS REPAIR    . TONSILLECTOMY    . tummy tuck     Family History  Problem Relation Age of Onset  . Arthritis Mother   . Heart disease Mother   . Arthritis Brother   . Heart disease Brother   . Hypertension Brother   . Cancer Brother        3 bothers melanoma  . Breast cancer Neg Hx     Allergies: Patient has no known allergies. Current Outpatient Prescriptions on File Prior to Visit  Medication Sig Dispense Refill  . Multiple Vitamin (MULTIVITAMIN) tablet Take 1 tablet by mouth daily.    . SYMBICORT 80-4.5 MCG/ACT inhaler      No current facility-administered medications on file prior to visit.     Social History  Substance Use Topics  . Smoking status: Never Smoker  . Smokeless tobacco: Never Used  . Alcohol use No    Review of Systems  Constitutional: Negative for chills and fever.  Respiratory: Negative for cough.     Cardiovascular: Negative for chest pain, palpitations and leg swelling.  Gastrointestinal: Negative for nausea and vomiting.  Musculoskeletal: Negative for joint swelling.  Neurological: Negative for numbness.      Objective:    BP (!) 150/90   Pulse 64   Temp 97.6 F (36.4 C) (Oral)   Ht 5\' 5"  (1.651 m)   Wt 279 lb 12.8 oz (126.9 kg)   SpO2 96%   BMI 46.56 kg/m  Wt Readings from Last 3 Encounters:  02/18/17 279 lb 12.8 oz (126.9 kg)  01/07/17 280 lb (127 kg)  12/29/16 288 lb 12.8 oz (131 kg)     Physical Exam  Constitutional: She appears well-developed and well-nourished.  Eyes: Conjunctivae are normal.  Cardiovascular: Normal rate, regular rhythm, normal heart sounds and normal pulses.   No LE edema, palpable cords or masses. No erythema or increased warmth. No asymmetry in calf size when compared bilaterally LE hair growth symmetric and present. No discoloration of varicosities noted. LE warm and palpable pedal pulses.    Pulmonary/Chest: Effort normal and breath sounds normal. She has no wheezes. She has no rhonchi. She has no rales.  Musculoskeletal:       Right foot: There is normal range of motion, no tenderness, no bony  tenderness and no swelling.       Left foot: There is normal range of motion, no tenderness, no bony tenderness and no swelling.  No pain with dorsiflexion, plantar flexion of bilateral extremities.    Neurological: She is alert.  Sensation intact BLE.   Skin: Skin is warm and dry.  Psychiatric: She has a normal mood and affect. Her speech is normal and behavior is normal. Thought content normal.  Vitals reviewed.      Assessment & Plan:   1. Pain in both feet Etiology of pain is nonspecific at this time.Differentials include plantar fasciitis, peripheral neuropathy.Will treat empirically for both with conservative therapies ice, NSAIDs,supportive shoes, and trial of  Gabapentin.  Pending lab studies as well. Return precautions given.  -  B12 and Folate Panel - gabapentin (NEURONTIN) 100 MG capsule; Take 2 capsules (200 mg total) by mouth at bedtime.  Dispense: 90 capsule; Refill: 3    I am having Ms. Krygier start on gabapentin. I am also having her maintain her SYMBICORT and multivitamin.   Meds ordered this encounter  Medications  . gabapentin (NEURONTIN) 100 MG capsule    Sig: Take 2 capsules (200 mg total) by mouth at bedtime.    Dispense:  90 capsule    Refill:  3    Order Specific Question:   Supervising Provider    Answer:   Crecencio Mc [[id number]]    Return precautions given.   Risks, benefits, and alternatives of the medications and treatment plan prescribed today were discussed, and patient expressed understanding.   Education regarding symptom management and diagnosis given to patient on AVS.  Continue to follow with Burnard Hawthorne, FNP for routine health maintenance.   Karl Luke and I agreed with plan.   [name], FNP

## 2017-02-18 NOTE — Progress Notes (Signed)
Pre visit review using our clinic review tool, if applicable. No additional management support is needed unless otherwise documented below in the visit note. 

## 2017-02-24 ENCOUNTER — Encounter: Payer: Self-pay | Admitting: Dietician

## 2017-02-24 ENCOUNTER — Encounter: Payer: BC Managed Care – PPO | Attending: Family | Admitting: Dietician

## 2017-02-24 VITALS — Ht 65.0 in | Wt 279.4 lb

## 2017-02-24 DIAGNOSIS — Z6841 Body Mass Index (BMI) 40.0 and over, adult: Secondary | ICD-10-CM

## 2017-02-24 NOTE — Patient Instructions (Addendum)
   Continue to try and buy healthier snack foods for evening snacking, great job! Choose foods with less than 10g of fat and sugar per serving  Look for powdered peanut butter- less calories and fat  Consider adding a snack or two during the day to help prevent overeating at night. *Whole-food based snack (mix of protein and either some healthy fats or complex carbohydrates)  Use applesauce, olive oil or pumpkin in place of butter in recipes  Consider incorporating smoothies (adding tofu, oats, yogurt for protein, flax seed, chia seed, veggies)  Include 15-20g protein at each meal, and snacks when able (3-4oz)

## 2017-02-24 NOTE — Progress Notes (Signed)
Medical Nutrition Therapy: Visit start time: 5732  end time: 1630  Assessment:  Diagnosis: obesity, joint pain Past medical history: osteoarthritis, see chart Psychosocial issues/ stress concerns: none Preferred learning method:  . Auditory . Visual . Hands-on  Current weight: 279.4lb  Height: 5'5" Medications, supplements: Ca + Vit D, gabapentin, MVI, see chart  Progress and evaluation: Patient has been closely tracking her daily calories, daily activity and weight for 200 days in the Lose It phone app. Starting wt: 311#, CBW: 279#. Goal wt 200# which she was last at 27yrs ago. C/o now hitting a plateau and is unable to lose more weight. Describes herself as overweight "her whole life" and as an emotional eater who gravitates towards carb-dense foods. Current target calories per app are 1942kcal/ day with wt loss rate set at 1.5#/week. Dietary interventions starting a "few" years ago and continued to date include: no longer eats pork or red meat, does not eat fried food, increased dietary protein intake, eating whole grain breads occasionally. Reports being able to lower her BP through these interventions. States she typically does not get a full lunch at work d/t being busy with the children and doesn't eat much at breakfast d/t getting up early, often resulting in over eating at night. C/o increased hunger and cravings for sweets at night.   Physical activity: brisk walk for 70min 5 days/week.  Dietary Intake:  Usual eating pattern includes 2-3 meals and 2 snacks per day. Dining out frequency: 1-2 meals per month. (weekends)  Breakfast: protein bar + banana Snack: n/a Lunch: yogurt + kashi cereal + orange or apple Snack: bag of chips, pretzels, cheese crackers, occasionally cupcake for a student's birthday Supper: salad with tuna, avocado, tomato, cucumber and crackers or chips + veggie chips, fish + rice + vegetable, rice noodle pad thai (homemade), sandwich if husband is out of  town Snack: piece of cake or cupcake "quite often", popcorn (air popped or stove top made with EVOO) Beverages: water, hot tea occasionally, 1/2 glass OJ on weekend mornings only  Nutrition Care Education: Topics covered: definition of slow/ sustainable weight loss (.5-2#/wk with .5-1#/wk preferred for women typically), healthy snack swaps, used Lost It app to guide patient to recommended daily calorie goals and percent of total calories coming from each macronutrient based on her individual stats, emotional eating, Mediterranean diet parameters Basic nutrition: basic food groups, appropriate nutrient balance, appropriate meal and snack schedule, general nutrition guidelines, not skipping meals, complex vs simple carbohydrate sources Weight control: benefits of weight control, identifying healthy weight, determining reasonable weight goal, behavioral changes for weight loss Advanced nutrition:  recipe modification, cooking techniques, dining out, food label reading Other lifestyle changes:  benefits of making changes, increasing motivation, readiness for change, identifying habits that need to change  Nutritional Diagnosis:  [place]-3.3 Overweight/obesity As related to h/o poor diet/exercise habits.  As evidenced by BMI 46.9.   Intervention: Discussion as noted above. Patient agrees to start integrating 1-2 snacks during the first part of her day and to start trying healthy versions of her current favorite starchy/ sugary snacks, among other goals established during our session. She is on her way towards her goal weight.  Education Materials given:  Marland Kitchen Mediterranean diet packet and shopping list (anti-inflammatory) . Quick and Healthy Meals . Strategies for weight loss . Goals/ instructions  Learner/ who was taught:  . Patient  Level of understanding: Marland Kitchen Verbalizes/ demonstrates competency  Demonstrated degree of understanding via:   Teach back Learning barriers: .  None  Willingness to learn/  readiness for change: . Eager, change in progress  Monitoring and Evaluation:  Dietary intake, exercise, and body weight      follow up: prn

## 2017-03-24 ENCOUNTER — Telehealth: Payer: Self-pay | Admitting: Family

## 2017-03-24 DIAGNOSIS — M79671 Pain in right foot: Secondary | ICD-10-CM

## 2017-03-24 DIAGNOSIS — M79672 Pain in left foot: Principal | ICD-10-CM

## 2017-03-24 NOTE — Telephone Encounter (Signed)
Pt states that she is still having foot pain and wanted to know what the next step was Please advise

## 2017-03-25 NOTE — Telephone Encounter (Signed)
Please advise 

## 2017-03-25 NOTE — Telephone Encounter (Signed)
Patient advised of result and verbalized an understanding.  

## 2017-03-25 NOTE — Telephone Encounter (Signed)
Call pt Sorry to hear- if not improvement on gabapentin and conservative therapy, I would advise podiatry  For plantar fasciitis, may benefit from steriod injection by podiatry Referral placed

## 2017-04-09 ENCOUNTER — Ambulatory Visit: Payer: BC Managed Care – PPO | Admitting: Podiatry

## 2017-04-13 ENCOUNTER — Ambulatory Visit: Payer: BC Managed Care – PPO | Admitting: Podiatry

## 2017-04-19 ENCOUNTER — Telehealth (HOSPITAL_COMMUNITY): Payer: Self-pay

## 2017-04-19 NOTE — Telephone Encounter (Signed)
Copied from [place]. Topic: Inquiry >> Apr 19, 2017  3:26 PM Malena Catholic I, NT wrote: Reason for CRM: pt call said she want to go to [facility] weight Lost,she need Doctor [facility] Nurse to call Them with her Medical history [phone]

## 2017-04-23 ENCOUNTER — Ambulatory Visit (INDEPENDENT_AMBULATORY_CARE_PROVIDER_SITE_OTHER): Payer: BC Managed Care – PPO | Admitting: Podiatry

## 2017-04-23 ENCOUNTER — Encounter: Payer: Self-pay | Admitting: Podiatry

## 2017-04-23 ENCOUNTER — Encounter: Payer: Self-pay | Admitting: Family Medicine

## 2017-04-23 ENCOUNTER — Ambulatory Visit (INDEPENDENT_AMBULATORY_CARE_PROVIDER_SITE_OTHER): Payer: BC Managed Care – PPO

## 2017-04-23 ENCOUNTER — Ambulatory Visit: Payer: BC Managed Care – PPO | Admitting: Family Medicine

## 2017-04-23 VITALS — BP 132/82 | HR 68 | Temp 98.3°F | Wt 283.0 lb

## 2017-04-23 DIAGNOSIS — B9789 Other viral agents as the cause of diseases classified elsewhere: Secondary | ICD-10-CM | POA: Diagnosis not present

## 2017-04-23 DIAGNOSIS — M76829 Posterior tibial tendinitis, unspecified leg: Secondary | ICD-10-CM | POA: Diagnosis not present

## 2017-04-23 DIAGNOSIS — M722 Plantar fascial fibromatosis: Secondary | ICD-10-CM

## 2017-04-23 DIAGNOSIS — J069 Acute upper respiratory infection, unspecified: Secondary | ICD-10-CM

## 2017-04-23 MED ORDER — ALBUTEROL SULFATE HFA 108 (90 BASE) MCG/ACT IN AERS: 2.0000 | INHALATION_SPRAY | RESPIRATORY_TRACT | 1 refills | Status: DC | PRN

## 2017-04-23 MED ORDER — HYDROCODONE-HOMATROPINE 5-1.5 MG/5ML PO SYRP: 5.0000 mL | ORAL_SOLUTION | Freq: Three times a day (TID) | ORAL | 0 refills | Status: DC | PRN

## 2017-04-23 MED ORDER — MELOXICAM 15 MG PO TABS: 15.0000 mg | ORAL_TABLET | Freq: Every day | ORAL | 1 refills | Status: DC

## 2017-04-23 NOTE — Progress Notes (Signed)
Subjective:    Patient ID: Marie Solis, female    DOB: 22-May-1966, 51 y.o.   MRN: [id number]  HPI This is a 51 yo female who presents today with 2 days of sore throat, cough, fever to 100. Sinus pressure and lightheadedness. Mostly dry cough. Took some ibuprofen, robitussin with a little relief. Throat pain with eating. Some wheezing last night. Uses symbicort prn, does not have a rescue inhaler (hers expired).  Feels a little better today. 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   Past Medical History:  Diagnosis Date  . Arthritis   . Asthma   . Colon polyps   . Hypertension    Past Surgical History:  Procedure Laterality Date  . COLONOSCOPY    . COLONOSCOPY WITH PROPOFOL N/A 01/07/2017   Procedure: COLONOSCOPY WITH PROPOFOL;  Surgeon: Lin Landsman, MD;  Location: [facility] ENDOSCOPY;  Service: Endoscopy;  Laterality: N/A;  . QUADRICEPS REPAIR    . TONSILLECTOMY    . tummy tuck     Family History  Problem Relation Age of Onset  . Arthritis Mother   . Heart disease Mother   . Arthritis Brother   . Heart disease Brother   . Hypertension Brother   . Cancer Brother        3 bothers melanoma  . Breast cancer Neg Hx    Social History   Tobacco Use  . Smoking status: Never Smoker  . Smokeless tobacco: Never Used  Substance Use Topics  . Alcohol use: No  . Drug use: No      Review of Systems Per HPI    Objective:   Physical Exam  Constitutional: She is oriented to person, place, and time. She appears well-developed and well-nourished. No distress.  HENT:  Head: Normocephalic and atraumatic.  Right Ear: Tympanic membrane, external ear and ear canal normal.  Left Ear: Tympanic membrane, external ear and ear canal normal.  Nose: Mucosal edema present. No rhinorrhea.  Mouth/Throat: Uvula is midline, oropharynx is clear and moist and mucous membranes are normal. No oropharyngeal exudate, posterior oropharyngeal edema or posterior oropharyngeal erythema.   Tonsils surgically absent. Post nasal drainage visible.   Eyes: Conjunctivae are normal.  Neck: Normal range of motion. Neck supple.  Cardiovascular: Normal rate, regular rhythm and normal heart sounds.  Pulmonary/Chest: Effort normal and breath sounds normal.  Lymphadenopathy:    She has no cervical adenopathy.  Neurological: She is alert and oriented to person, place, and time.  Skin: Skin is warm and dry. She is not diaphoretic.  Psychiatric: She has a normal mood and affect. Her behavior is normal. Judgment and thought content normal.  Vitals reviewed.     BP 132/82 (BP Location: Left Arm, Patient Position: Sitting, Cuff Size: Normal)   Pulse 68   Temp 98.3 F (36.8 C) (Oral)   Wt 283 lb (128.4 kg)   SpO2 97%   BMI 47.09 kg/m  Wt Readings from Last 3 Encounters:  04/23/17 283 lb (128.4 kg)  02/24/17 279 lb 6.4 oz (126.7 kg)  02/18/17 279 lb 12.8 oz (126.9 kg)       Assessment & Plan:  1. Viral URI with cough -  Patient Instructions  Please add the following-  Mucinex, Zyrtec- generic is fine for both  Increase fluids, rest  If not better in 5-7 days or if worse please follow up  - albuterol (PROVENTIL HFA;VENTOLIN HFA) 108 (90 Base) MCG/ACT inhaler; Inhale 2 puffs into the lungs every 4 (four)  hours as needed for wheezing or shortness of breath (cough, shortness of breath or wheezing.).  Dispense: 1 Inhaler; Refill: 1 - HYDROcodone-homatropine (HYCODAN) 5-1.5 MG/5ML syrup; Take 5 mLs by mouth every 8 (eight) hours as needed for cough.  Dispense: 120 mL; Refill: 0   Clarene Reamer, FNP-BC  [facility]  04/23/2017 2:50 PM

## 2017-04-23 NOTE — Patient Instructions (Signed)
Please add the following-  Mucinex, Zyrtec- generic is fine for both  Increase fluids, rest  If not better in 5-7 days or if worse please follow up

## 2017-04-26 NOTE — Progress Notes (Signed)
    HPI: 51 year old female presenting today as a new patient with a chief complaint of intermittent sharp, stabbing pain of the 1st MPJ of bilateral feet that began gradually about 8 months ago. She states the pain is now progressively worsening and wakes her at night. Walking increases the pain. Resting the feet helps to alleviate the pain. She has been wearing supportive shoes and taking Ibuprofen for treatment. Patient is here for further evaluation and treatment.    Past Medical History:  Diagnosis Date  . Arthritis   . Asthma   . Colon polyps   . Hypertension        Physical Exam: General: The patient is alert and oriented x3 in no acute distress.  Dermatology: Skin is warm, dry and supple bilateral lower extremities. Negative for open lesions or macerations.  Vascular: Palpable pedal pulses bilaterally. No edema or erythema noted. Capillary refill within normal limits.  Neurological: Epicritic and protective threshold grossly intact bilaterally.   Musculoskeletal Exam: Pain on palpation noted to the posterior tibial tendon of the bilateral feet. Range of motion within normal limits. Muscle strength 5/5 in all muscle groups bilateral lower extremities.  Radiographic Exam:  Normal osseous mineralization. Joint spaces preserved. No fracture or dislocation identified.    Assessment: 1. Posterior tibial tendinitis bilateral   Plan of Care:  1. Patient was evaluated. Radiographs were reviewed today. 2. Appointment with Liliane Channel for custom molded orthotics. 3. Prescription for Meloxicam provided to patient.  4. Continue wearing good shoe gear. Do not go barefoot. 5. Return to clinic when necessary.   Edrick Kins, DPM [facility]  Dr. Edrick Kins, [address]                                        [place], [place] [address]                Office [phone]  Fax [phone]

## 2017-04-27 ENCOUNTER — Other Ambulatory Visit: Payer: Self-pay

## 2017-04-27 ENCOUNTER — Encounter: Payer: Self-pay | Admitting: Family Medicine

## 2017-04-27 ENCOUNTER — Ambulatory Visit: Payer: BC Managed Care – PPO | Admitting: Family Medicine

## 2017-04-27 DIAGNOSIS — J0100 Acute maxillary sinusitis, unspecified: Secondary | ICD-10-CM | POA: Diagnosis not present

## 2017-04-27 MED ORDER — HYDROCODONE-HOMATROPINE 5-1.5 MG/5ML PO SYRP: 5.0000 mL | ORAL_SOLUTION | Freq: Three times a day (TID) | ORAL | 0 refills | Status: DC | PRN

## 2017-04-27 MED ORDER — AMOXICILLIN-POT CLAVULANATE 875-125 MG PO TABS: 1.0000 | ORAL_TABLET | Freq: Two times a day (BID) | ORAL | 0 refills | Status: DC

## 2017-04-27 NOTE — Patient Instructions (Signed)
Nice to see you. We will treat you with Augmentin for your sinus infection. Please use your albuterol inhaler if you feel that you are wheezing. It would be beneficial to start on your Symbicort given your chronic cough issues related to asthma. If you are not improving with this treatment please let us know. Please be careful with the cough medicine as it may make you drowsy.

## 2017-04-27 NOTE — Assessment & Plan Note (Signed)
Symptoms and duration consistent with sinusitis.  We will treat with Augmentin.  Hycodan for cough.  Advised this could make her drowsy.  She will restart on Symbicort given history of asthma.  Albuterol as needed.  Given return precautions.  Follow-up in 2-3 months for asthma.

## 2017-04-27 NOTE — Progress Notes (Signed)
  Tommi Rumps, MD Phone: [phone]  Marie Solis is a 51 y.o. female who presents today for MD visit.  Patient recently seen for URI.  Treated supportively though has not improved.  Continues have maxillary sinus pressure, sore throat, cough, and postnasal drip.  Occasional wheezing at night.  Blowing yellow-green mucus out of her nose.  Some mild lightheadedness.  T-max 100 F.  Has been on Symbicort in the past for asthma.  Does note chronic cough related to asthma. Does wheeze when not sick.   Social History   Tobacco Use  Smoking Status Never Smoker  Smokeless Tobacco Never Used     ROS see history of present illness  Objective  Physical Exam Vitals:   04/27/17 1314  BP: 120/80  Pulse: 64  Temp: 98 F (36.7 C)  SpO2: 98%    BP Readings from Last 3 Encounters:  04/27/17 120/80  04/23/17 132/82  02/18/17 (!) 150/90   Wt Readings from Last 3 Encounters:  04/27/17 289 lb (131.1 kg)  04/23/17 283 lb (128.4 kg)  02/24/17 279 lb 6.4 oz (126.7 kg)    Physical Exam  Constitutional: No distress.  HENT:  Head: Normocephalic and atraumatic.  Mouth/Throat: Oropharynx is clear and moist. No oropharyngeal exudate.  Normal tms, no sinus tenderness to percussion  Eyes: Conjunctivae are normal. Pupils are equal, round, and reactive to light.  Neck: Neck supple.  Cardiovascular: Normal rate, regular rhythm and normal heart sounds.  Pulmonary/Chest: Effort normal and breath sounds normal.  Lymphadenopathy:    She has no cervical adenopathy.  Neurological: She is alert. Gait normal.  Skin: Skin is warm and dry. She is not diaphoretic.     Assessment/Plan: Please see individual problem list.  Acute non-recurrent maxillary sinusitis Symptoms and duration consistent with sinusitis.  We will treat with Augmentin.  Hycodan for cough.  Advised this could make her drowsy.  She will restart on Symbicort given history of asthma.  Albuterol as needed.  Given return  precautions.  Follow-up in 2-3 months for asthma.   Marie Solis was seen today for cough.  Diagnoses and all orders for this visit:  Acute non-recurrent maxillary sinusitis -     HYDROcodone-homatropine (HYCODAN) 5-1.5 MG/5ML syrup; Take 5 mLs by mouth every 8 (eight) hours as needed for cough.  Other orders -     amoxicillin-clavulanate (AUGMENTIN) 875-125 MG tablet; Take 1 tablet by mouth 2 (two) times daily.    No orders of the defined types were placed in this encounter.   Meds ordered this encounter  Medications  . amoxicillin-clavulanate (AUGMENTIN) 875-125 MG tablet    Sig: Take 1 tablet by mouth 2 (two) times daily.    Dispense:  14 tablet    Refill:  0  . HYDROcodone-homatropine (HYCODAN) 5-1.5 MG/5ML syrup    Sig: Take 5 mLs by mouth every 8 (eight) hours as needed for cough.    Dispense:  120 mL    Refill:  0     Tommi Rumps, MD [place]

## 2017-05-11 ENCOUNTER — Other Ambulatory Visit: Payer: BC Managed Care – PPO | Admitting: Orthotics

## 2017-06-11 ENCOUNTER — Telehealth: Payer: Self-pay

## 2017-06-11 NOTE — Telephone Encounter (Signed)
Copied from [place] [id number]. Topic: Referral - Request >> Jun 11, 2017 10:20 AM Yvette Rack wrote: Reason for CRM: pt call said she want to go to [facility] Lost in [place] ,she states that she had called in November for a referral but hasn't heard from anyone

## 2017-06-24 ENCOUNTER — Encounter: Payer: Self-pay | Admitting: Family Medicine

## 2017-06-24 ENCOUNTER — Ambulatory Visit: Payer: BC Managed Care – PPO | Admitting: Family Medicine

## 2017-06-24 VITALS — BP 140/82 | HR 76 | Temp 98.5°F | Wt 295.4 lb

## 2017-06-24 DIAGNOSIS — J4521 Mild intermittent asthma with (acute) exacerbation: Secondary | ICD-10-CM | POA: Diagnosis not present

## 2017-06-24 MED ORDER — BENZONATATE 100 MG PO CAPS: 100.0000 mg | ORAL_CAPSULE | Freq: Three times a day (TID) | ORAL | 0 refills | Status: DC

## 2017-06-24 MED ORDER — AZITHROMYCIN 250 MG PO TABS: ORAL_TABLET | ORAL | 0 refills | Status: DC

## 2017-06-24 MED ORDER — PREDNISONE 10 MG PO TABS: ORAL_TABLET | ORAL | 0 refills | Status: DC

## 2017-06-24 MED ORDER — ALBUTEROL SULFATE (2.5 MG/3ML) 0.083% IN NEBU
2.5000 mg | INHALATION_SOLUTION | Freq: Once | RESPIRATORY_TRACT | Status: AC
  Administered 2017-06-24: 2.5 mg via RESPIRATORY_TRACT

## 2017-06-24 NOTE — Progress Notes (Signed)
Subjective:    Patient ID: Marie Solis, female    DOB: 1965-09-25, 52 y.o.   MRN: [id number]  HPI  Marie Solis is a 52 year old female who presents with a cough that has been present for 2 days.  History of asthma and symbicort use. She reports adherence with symbicort and denies using albuterol. She reports using albuterol no more than once/month.  Onset: 2 days ago  Trigger: upper respiratory symptoms prior to cough  Course: URI symptoms over one week ago, cough started 2 days ago and she reports associated wheezing  Treatment/efficacy: No treatments have been tried at home. She is adherent with symbicort use.  Associated symptoms: Fever/Chills/Sweats: No Facial Pain: sinus pressure/pain Frontal Headaches: Mild in frontal sinus presure Nasal congestion/purulence: Purulence with nasal drainage Dental pain: No Sore throat: Yes Ear pain/pressure: Ear pressure, denies pain Itchy/watery eyes: yes Cough: productive of green sputum Pleuritic pain: No Dyspnea: Mild SOB Wheezing: Yes History of chronic wheezing when not sick. She has been evaluated by pulmonology in her past and has been maintained on symbicort. Last antibiotic use 04/27/17 with Augmentin for sinusitis. She had hycodan for cough at that time.  Review of Systems  Constitutional: Negative for chills, fatigue and fever.  HENT: Positive for congestion, postnasal drip, rhinorrhea, sinus pain and sore throat.   Eyes: Positive for itching.  Respiratory: Positive for cough and wheezing.   Cardiovascular: Negative for chest pain and palpitations.  Gastrointestinal: Negative for abdominal pain, constipation, nausea and vomiting.  Skin: Negative for rash.  Neurological: Negative for dizziness, light-headedness and headaches.   See HPI above   Past Medical History:  Diagnosis Date  . Arthritis   . Asthma   . Colon polyps   . Hypertension      Social History   Socioeconomic History  . Marital status:  Married    Spouse name: Not on file  . Number of children: Not on file  . Years of education: Not on file  . Highest education level: Not on file  Social Needs  . Financial resource strain: Not on file  . Food insecurity - worry: Not on file  . Food insecurity - inability: Not on file  . Transportation needs - medical: Not on file  . Transportation needs - non-medical: Not on file  Occupational History  . Not on file  Tobacco Use  . Smoking status: Never Smoker  . Smokeless tobacco: Never Used  Substance and Sexual Activity  . Alcohol use: No  . Drug use: No  . Sexual activity: Not on file  Other Topics Concern  . Not on file  Social History Narrative   From [place] her   Corporate treasurer       Past Surgical History:  Procedure Laterality Date  . COLONOSCOPY    . COLONOSCOPY WITH PROPOFOL N/A 01/07/2017   Procedure: COLONOSCOPY WITH PROPOFOL;  Surgeon: Lin Landsman, MD;  Location: [facility] ENDOSCOPY;  Service: Endoscopy;  Laterality: N/A;  . QUADRICEPS REPAIR    . TONSILLECTOMY    . tummy tuck      Family History  Problem Relation Age of Onset  . Arthritis Mother   . Heart disease Mother   . Arthritis Brother   . Heart disease Brother   . Hypertension Brother   . Cancer Brother        3 bothers melanoma  . Breast cancer Neg Hx     No Known Allergies  Current Outpatient  Medications on File Prior to Visit  Medication Sig Dispense Refill  . calcium citrate-vitamin D (CITRACAL+D) 315-200 MG-UNIT tablet Take 1 tablet by mouth 2 (two) times daily.    . Multiple Vitamin (MULTIVITAMIN) tablet Take 1 tablet by mouth daily.    . naproxen (NAPROSYN) 250 MG tablet Take by mouth 2 (two) times daily with a meal.    . SYMBICORT 80-4.5 MCG/ACT inhaler     . albuterol (PROVENTIL HFA;VENTOLIN HFA) 108 (90 Base) MCG/ACT inhaler Inhale 2 puffs into the lungs every 4 (four) hours as needed for wheezing or shortness of breath (cough, shortness of breath or wheezing.).  (Patient not taking: Reported on 06/24/2017) 1 Inhaler 1   No current facility-administered medications on file prior to visit.     BP 140/82   Pulse 76   Temp 98.5 F (36.9 C) (Oral)   Wt 295 lb 6.4 oz (134 kg)   SpO2 96%   BMI 49.16 kg/m       Objective:   Physical Exam  Constitutional: She is oriented to person, place, and time. She appears well-developed and well-nourished.  HENT:  Right Ear: Tympanic membrane normal.  Left Ear: Tympanic membrane normal.  Nose: Rhinorrhea present. Right sinus exhibits no maxillary sinus tenderness and no frontal sinus tenderness. Left sinus exhibits no maxillary sinus tenderness and no frontal sinus tenderness.  Mouth/Throat: Oropharynx is clear and moist and mucous membranes are normal. No posterior oropharyngeal edema or posterior oropharyngeal erythema.  Eyes: Pupils are equal, round, and reactive to light. No scleral icterus.  Neck: Neck supple.  Cardiovascular: Normal rate, regular rhythm and intact distal pulses.  Pulmonary/Chest: Effort normal and breath sounds normal. She has no rales.  Scattered wheezing noted  Abdominal: Soft. Bowel sounds are normal. There is no tenderness.  Musculoskeletal: She exhibits no edema.  Lymphadenopathy:    She has no cervical adenopathy.  Neurological: She is alert and oriented to person, place, and time.  Skin: Skin is warm and dry. No rash noted.  Psychiatric: She has a normal mood and affect. Her behavior is normal. Judgment and thought content normal.       Assessment & Plan:  1. Bronchitis, asthmatic, mild intermittent, with acute exacerbation Scattered wheezing improved after in office nebulizer treatment. Oxygen saturation 96%. Breathing: Effort normal;  Prior documentation of O2 saturation of 76% was documented in error.  Oral prednisone taper provided for symptoms with benzonatate for cough as needed. She is not interested in hycodan for cough and prefers benzonatate at this time. Further  advised that she could use Mucinex DM for cough instead of benzonate if desired. With history of asthma and concern for sudden onset of symptoms; azithromycin was provided for symptoms that do not improve with supportive measures.  Close follow up for further evaluation discussed. We reviewed the importance of keeping asthma well controlled and reviewed the use of rescue vs maintenance inhalers. She voiced understanding and agreed with plan. Return precautions provided.  Delano Metz, FNP-C

## 2017-06-24 NOTE — Patient Instructions (Addendum)
Please continue symbicort as prescribed by your provider. Albuterol can be used if needed with symptoms. Prednisone can be taken with food to improve wheezing. Benzonatate can be used for cough or you can use Mucinex DM which is over the counter An antibiotic has been provided for you if symptoms do not improve with treatment, worsen, or new symptoms develop.   Asthma, Adult Asthma is a condition of the lungs in which the airways tighten and narrow. Asthma can make it hard to breathe. Asthma cannot be cured, but medicine and lifestyle changes can help control it. Asthma may be started (triggered) by:  Animal skin flakes (dander).  Dust.  Cockroaches.  Pollen.  Mold.  Smoke.  Cleaning products.  Hair sprays or aerosol sprays.  Paint fumes or strong smells.  Cold air, weather changes, and winds.  Crying or laughing hard.  Stress.  Certain medicines or drugs.  Foods, such as dried fruit, potato chips, and sparkling grape juice.  Infections or conditions (colds, flu).  Exercise.  Certain medical conditions or diseases.  Exercise or tiring activities.  Follow these instructions at home:  Take medicine as told by your doctor.  Use a peak flow meter as told by your doctor. A peak flow meter is a tool that measures how well the lungs are working.  Record and keep track of the peak flow meter's readings.  Understand and use the asthma action plan. An asthma action plan is a written plan for taking care of your asthma and treating your attacks.  To help prevent asthma attacks: ? Do not smoke. Stay away from secondhand smoke. ? Change your heating and air conditioning filter often. ? Limit your use of fireplaces and wood stoves. ? Get rid of pests (such as roaches and mice) and their droppings. ? Throw away plants if you see mold on them. ? Clean your floors. Dust regularly. Use cleaning products that do not smell. ? Have someone vacuum when you are not home. Use a  vacuum cleaner with a HEPA filter if possible. ? Replace carpet with wood, tile, or vinyl flooring. Carpet can trap animal skin flakes and dust. ? Use allergy-proof pillows, mattress covers, and box spring covers. ? Wash bed sheets and blankets every week in hot water and dry them in a dryer. ? Use blankets that are made of polyester or cotton. ? Clean bathrooms and kitchens with bleach. If possible, have someone repaint the walls in these rooms with mold-resistant paint. Keep out of the rooms that are being cleaned and painted. ? Wash hands often. Contact a doctor if:  You have make a whistling sound when breaking (wheeze), have shortness of breath, or have a cough even if taking medicine to prevent attacks.  The colored mucus you cough up (sputum) is thicker than usual.  The colored mucus you cough up changes from clear or white to yellow, green, gray, or bloody.  You have problems from the medicine you are taking such as: ? A rash. ? Itching. ? Swelling. ? Trouble breathing.  You need reliever medicines more than 2-3 times a week.  Your peak flow measurement is still at 50-79% of your personal best after following the action plan for 1 hour.  You have a fever. Get help right away if:  You seem to be worse and are not responding to medicine during an asthma attack.  You are short of breath even at rest.  You get short of breath when doing very little activity.  You have trouble eating, drinking, or talking.  You have chest pain.  You have a fast heartbeat.  Your lips or fingernails start to turn blue.  You are light-headed, dizzy, or faint.  Your peak flow is less than 50% of your personal best. This information is not intended to replace advice given to you by your health care provider. Make sure you discuss any questions you have with your health care provider. Document Released: 10/28/2007 Document Revised: 10/17/2015 Document Reviewed: 12/08/2012 Elsevier  Interactive Patient Education  2017 Elsevier Inc.  Acute Bronchitis, Adult Acute bronchitis is when air tubes (bronchi) in the lungs suddenly get swollen. The condition can make it hard to breathe. It can also cause these symptoms:  A cough.  Coughing up clear, yellow, or green mucus.  Wheezing.  Chest congestion.  Shortness of breath.  A fever.  Body aches.  Chills.  A sore throat.  Follow these instructions at home: Medicines  Take over-the-counter and prescription medicines only as told by your doctor.  If you were prescribed an antibiotic medicine, take it as told by your doctor. Do not stop taking the antibiotic even if you start to feel better. General instructions  Rest.  Drink enough fluids to keep your pee (urine) clear or pale yellow.  Avoid smoking and secondhand smoke. If you smoke and you need help quitting, ask your doctor. Quitting will help your lungs heal faster.  Use an inhaler, cool mist vaporizer, or humidifier as told by your doctor.  Keep all follow-up visits as told by your doctor. This is important. How is this prevented? To lower your risk of getting this condition again:  Wash your hands often with soap and water. If you cannot use soap and water, use hand sanitizer.  Avoid contact with people who have cold symptoms.  Try not to touch your hands to your mouth, nose, or eyes.  Make sure to get the flu shot every year.  Contact a doctor if:  Your symptoms do not get better in 2 weeks. Get help right away if:  You cough up blood.  You have chest pain.  You have very bad shortness of breath.  You become dehydrated.  You faint (pass out) or keep feeling like you are going to pass out.  You keep throwing up (vomiting).  You have a very bad headache.  Your fever or chills gets worse. This information is not intended to replace advice given to you by your health care provider. Make sure you discuss any questions you have with  your health care provider. Document Released: 10/28/2007 Document Revised: 12/18/2015 Document Reviewed: 10/30/2015 Elsevier Interactive Patient Education  [employer].

## 2017-06-30 ENCOUNTER — Other Ambulatory Visit: Payer: BC Managed Care – PPO | Admitting: Orthotics

## 2017-07-08 ENCOUNTER — Encounter: Payer: Self-pay | Admitting: Internal Medicine

## 2017-07-08 ENCOUNTER — Ambulatory Visit: Payer: BC Managed Care – PPO | Admitting: Internal Medicine

## 2017-07-08 VITALS — BP 110/74 | HR 82 | Temp 98.3°F | Ht 65.0 in | Wt 298.6 lb

## 2017-07-08 DIAGNOSIS — R6889 Other general symptoms and signs: Secondary | ICD-10-CM | POA: Diagnosis not present

## 2017-07-08 DIAGNOSIS — J329 Chronic sinusitis, unspecified: Secondary | ICD-10-CM

## 2017-07-08 LAB — POC INFLUENZA A&B (BINAX/QUICKVUE)
INFLUENZA B, POC: NEGATIVE
Influenza A, POC: NEGATIVE

## 2017-07-08 MED ORDER — DOXYCYCLINE HYCLATE 100 MG PO TABS: 100.0000 mg | ORAL_TABLET | Freq: Two times a day (BID) | ORAL | 0 refills | Status: DC

## 2017-07-08 NOTE — Progress Notes (Signed)
Pre visit review using our clinic review tool, if applicable. No additional management support is needed unless otherwise documented below in the visit note. 

## 2017-07-08 NOTE — Progress Notes (Signed)
Chief Complaint  Patient presents with  . Cough  . Sinusitis  . Fever   C/o cough congestion, fever, sinus pressure, chills since Tuesday. She initially though sinus pressure was getting better but its not. Tried Robitussion for cough which helps qhs to sleep she reports her ears hurt and she is coughing up mucous at night trouble breathing out of nose.  She has had h/a 8/10 watery eyes, sneezing more than coughing. 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    Review of Systems  Constitutional: Positive for chills, fever and malaise/fatigue.  HENT: Positive for congestion.   Respiratory: Positive for cough and shortness of breath.   Cardiovascular: Negative for chest pain.  Musculoskeletal: Positive for neck pain.  Skin: Negative for rash.   Past Medical History:  Diagnosis Date  . Arthritis   . Asthma   . Colon polyps   . Hypertension    Past Surgical History:  Procedure Laterality Date  . COLONOSCOPY    . COLONOSCOPY WITH PROPOFOL N/A 01/07/2017   Procedure: COLONOSCOPY WITH PROPOFOL;  Surgeon: Lin Landsman, MD;  Location: [facility] ENDOSCOPY;  Service: Endoscopy;  Laterality: N/A;  . QUADRICEPS REPAIR    . TONSILLECTOMY    . tummy tuck     Family History  Problem Relation Age of Onset  . Arthritis Mother   . Heart disease Mother   . Arthritis Brother   . Heart disease Brother   . Hypertension Brother   . Cancer Brother        3 bothers melanoma  . Breast cancer Neg Hx    Social History   Socioeconomic History  . Marital status: Married    Spouse name: Not on file  . Number of children: Not on file  . Years of education: Not on file  . Highest education level: Not on file  Social Needs  . Financial resource strain: Not on file  . Food insecurity - worry: Not on file  . Food insecurity - inability: Not on file  . Transportation needs - medical: Not on file  . Transportation needs - non-medical: Not on file  Occupational History  . Not on file  Tobacco  Use  . Smoking status: Never Smoker  . Smokeless tobacco: Never Used  Substance and Sexual Activity  . Alcohol use: No  . Drug use: No  . Sexual activity: Not on file  Other Topics Concern  . 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 albuterol (PROVENTIL HFA;VENTOLIN HFA) 108 (90 Base) MCG/ACT inhaler Inhale 2 puffs into the lungs every 4 (four) hours as needed for wheezing or shortness of breath (cough, shortness of breath or wheezing.).  Marland Kitchen azithromycin (ZITHROMAX) 250 MG tablet Take 2 tablets by mouth at once today, then one tablet daily for 4 days  . benzonatate (TESSALON) 100 MG capsule Take 1 capsule (100 mg total) by mouth 3 (three) times daily.  . calcium citrate-vitamin D (CITRACAL+D) 315-200 MG-UNIT tablet Take 1 tablet by mouth 2 (two) times daily.  . Multiple Vitamin (MULTIVITAMIN) tablet Take 1 tablet by mouth daily.  . naproxen (NAPROSYN) 250 MG tablet Take by mouth 2 (two) times daily with a meal.  . predniSONE (DELTASONE) 10 MG tablet Take 4 tablets once daily for 2 days, 3 tabs daily for 2 days, 2 tabs daily  for 2 days, 1 tab daily for 2 days. 2-  . SYMBICORT 80-4.5 MCG/ACT inhaler    No Known Allergies Recent Results (from the past 2160 hour(s))  POC Influenza A&B (Binax test)     Status: Normal   Collection Time: 07/08/17 10:58 AM  Result Value Ref Range   Influenza A, POC Negative Negative   Influenza B, POC Negative Negative   Objective  Body mass index is 49.69 kg/m. Wt Readings from Last 3 Encounters:  07/08/17 298 lb 9.6 oz (135.4 kg)  06/24/17 295 lb 6.4 oz (134 kg)  04/27/17 289 lb (131.1 kg)   Temp Readings from Last 3 Encounters:  07/08/17 98.3 F (36.8 C) (Oral)  06/24/17 98.5 F (36.9 C) (Oral)  04/27/17 98 F (36.7 C) (Oral)   BP Readings from Last 3 Encounters:  07/08/17 110/74  06/24/17 140/82  04/27/17 120/80   Pulse Readings  from Last 3 Encounters:  07/08/17 82  06/24/17 76  04/27/17 64   O2 sat room air 97%  Physical Exam  Constitutional: She is oriented to person, place, and time and well-developed, well-nourished, and in no distress. Vital signs are normal.  HENT:  Head: Normocephalic and atraumatic.  Eyes: Conjunctivae are normal. Pupils are equal, round, and reactive to light.  Neck:  mSK neck ttp   Cardiovascular: Normal rate, regular rhythm and normal heart sounds.  Pulmonary/Chest: Effort normal and breath sounds normal. She has no wheezes.  Neurological: She is alert and oriented to person, place, and time. Gait normal.  Skin: Skin is warm, dry and intact.  Psychiatric: Mood, memory, affect and judgment normal.  Nursing note and vitals reviewed.   Assessment   1. Sinusitis, URI, h/o asthmatic bronchitis w/o wheezing today nl O2 sat. Flu negative   Plan  1. Doxy bid x 10 days  Supportive care trial sudafed 3 days or less  Call back if not better Try NS, Flonase  Antihistamine.  Use maintenance and rescue inhalers   Provider: Dr. Olivia Mackie McLean-Scocuzza-Internal Medicine

## 2017-07-08 NOTE — Patient Instructions (Signed)
Please f/u in 1 week or less if needed  Otherwise 07/2017 for regular follow up  Sinusitis, Adult Sinusitis is soreness and inflammation of your sinuses. Sinuses are hollow spaces in the bones around your face. Your sinuses are located:  Around your eyes.  In the middle of your forehead.  Behind your nose.  In your cheekbones.  Your sinuses and nasal passages are lined with a stringy fluid (mucus). Mucus normally drains out of your sinuses. When your nasal tissues become inflamed or swollen, the mucus can become trapped or blocked so air cannot flow through your sinuses. This allows bacteria, viruses, and funguses to grow, which leads to infection. Sinusitis can develop quickly and last for 7?10 days (acute) or for more than 12 weeks (chronic). Sinusitis often develops after a cold. What are the causes? This condition is caused by anything that creates swelling in the sinuses or stops mucus from draining, including:  Allergies.  Asthma.  Bacterial or viral infection.  Abnormally shaped bones between the nasal passages.  Nasal growths that contain mucus (nasal polyps).  Narrow sinus openings.  Pollutants, such as chemicals or irritants in the air.  A foreign object stuck in the nose.  A fungal infection. This is rare.  What increases the risk? The following factors may make you more likely to develop this condition:  Having allergies or asthma.  Having had a recent cold or respiratory tract infection.  Having structural deformities or blockages in your nose or sinuses.  Having a weak immune system.  Doing a lot of swimming or diving.  Overusing nasal sprays.  Smoking.  What are the signs or symptoms? The main symptoms of this condition are pain and a feeling of pressure around the affected sinuses. Other symptoms include:  Upper toothache.  Earache.  Headache.  Bad breath.  Decreased sense of smell and taste.  A cough that may get worse at  night.  Fatigue.  Fever.  Thick drainage from your nose. The drainage is often green and it may contain pus (purulent).  Stuffy nose or congestion.  Postnasal drip. This is when extra mucus collects in the throat or back of the nose.  Swelling and warmth over the affected sinuses.  Sore throat.  Sensitivity to light.  How is this diagnosed? This condition is diagnosed based on symptoms, a medical history, and a physical exam. To find out if your condition is acute or chronic, your health care provider may:  Look in your nose for signs of nasal polyps.  Tap over the affected sinus to check for signs of infection.  View the inside of your sinuses using an imaging device that has a light attached (endoscope).  If your health care provider suspects that you have chronic sinusitis, you may also:  Be tested for allergies.  Have a sample of mucus taken from your nose (nasal culture) and checked for bacteria.  Have a mucus sample examined to see if your sinusitis is related to an allergy.  If your sinusitis does not respond to treatment and it lasts longer than 8 weeks, you may have an MRI or CT scan to check your sinuses. These scans also help to determine how severe your infection is. In rare cases, a bone biopsy may be done to rule out more serious types of fungal sinus disease. How is this treated? Treatment for sinusitis depends on the cause and whether your condition is chronic or acute. If a virus is causing your sinusitis, your symptoms will  go away on their own within 10 days. You may be given medicines to relieve your symptoms, including:  Topical nasal decongestants. They shrink swollen nasal passages and let mucus drain from your sinuses.  Antihistamines. These drugs block inflammation that is triggered by allergies. This can help to ease swelling in your nose and sinuses.  Topical nasal corticosteroids. These are nasal sprays that ease inflammation and swelling in  your nose and sinuses.  Nasal saline washes. These rinses can help to get rid of thick mucus in your nose.  If your condition is caused by bacteria, you will be given an antibiotic medicine. If your condition is caused by a fungus, you will be given an antifungal medicine. Surgery may be needed to correct underlying conditions, such as narrow nasal passages. Surgery may also be needed to remove polyps. Follow these instructions at home: Medicines  Take, use, or apply over-the-counter and prescription medicines only as told by your health care provider. These may include nasal sprays.  If you were prescribed an antibiotic medicine, take it as told by your health care provider. Do not stop taking the antibiotic even if you start to feel better. Hydrate and Humidify  Drink enough water to keep your urine clear or pale yellow. Staying hydrated will help to thin your mucus.  Use a cool mist humidifier to keep the humidity level in your home above 50%.  Inhale steam for 10-15 minutes, 3-4 times a day or as told by your health care provider. You can do this in the bathroom while a hot shower is running.  Limit your exposure to cool or dry air. Rest  Rest as much as possible.  Sleep with your head raised (elevated).  Make sure to get enough sleep each night. General instructions  Apply a warm, moist washcloth to your face 3-4 times a day or as told by your health care provider. This will help with discomfort.  Wash your hands often with soap and water to reduce your exposure to viruses and other germs. If soap and water are not available, use hand sanitizer.  Do not smoke. Avoid being around people who are smoking (secondhand smoke).  Keep all follow-up visits as told by your health care provider. This is important. Contact a health care provider if:  You have a fever.  Your symptoms get worse.  Your symptoms do not improve within 10 days. Get help right away if:  You have a  severe headache.  You have persistent vomiting.  You have pain or swelling around your face or eyes.  You have vision problems.  You develop confusion.  Your neck is stiff.  You have trouble breathing. This information is not intended to replace advice given to you by your health care provider. Make sure you discuss any questions you have with your health care provider. Document Released: 05/11/2005 Document Revised: 01/05/2016 Document Reviewed: 03/06/2015 Elsevier Interactive Patient Education  [employer].

## 2017-07-20 DIAGNOSIS — M722 Plantar fascial fibromatosis: Secondary | ICD-10-CM

## 2017-07-21 ENCOUNTER — Ambulatory Visit: Payer: BC Managed Care – PPO | Admitting: Orthotics

## 2017-07-21 DIAGNOSIS — M76829 Posterior tibial tendinitis, unspecified leg: Secondary | ICD-10-CM

## 2017-07-21 NOTE — Progress Notes (Signed)
Patient came in today to pick up custom made foot orthotics.  The goals were accomplished and the patient reported no dissatisfaction with said orthotics.  Patient was advised of breakin period and how to report any issues. 

## 2017-07-27 ENCOUNTER — Ambulatory Visit: Payer: BC Managed Care – PPO | Admitting: Family

## 2017-08-02 ENCOUNTER — Encounter: Payer: Self-pay | Admitting: Family

## 2017-08-02 ENCOUNTER — Ambulatory Visit: Payer: BC Managed Care – PPO | Admitting: Family

## 2017-08-02 VITALS — BP 130/94 | HR 73 | Temp 99.2°F | Resp 16 | Wt 298.5 lb

## 2017-08-02 DIAGNOSIS — E669 Obesity, unspecified: Secondary | ICD-10-CM | POA: Insufficient documentation

## 2017-08-02 DIAGNOSIS — R102 Pelvic and perineal pain: Secondary | ICD-10-CM | POA: Diagnosis not present

## 2017-08-02 DIAGNOSIS — M1990 Unspecified osteoarthritis, unspecified site: Secondary | ICD-10-CM | POA: Diagnosis not present

## 2017-08-02 MED ORDER — MELOXICAM 7.5 MG PO TABS: 7.5000 mg | ORAL_TABLET | Freq: Every day | ORAL | 0 refills | Status: DC

## 2017-08-02 NOTE — Progress Notes (Signed)
Subjective:    Patient ID: Marie Solis, female    DOB: 1965/11/06, 52 y.o.   MRN: [id number]  CC: Marie Solis is a 52 y.o. female who presents today for follow up.   HPI: Would like a referral for weight management  Complains of pain  in right  hip for last month, intermittent. Notices it more at night when laying on right side. Worse after walking more. Notices in pain knees . Not giving out.  No swelling, erythema, weakness, falls, numbness, tingling. No h/o GIB. Ibuprofen with some relif  Following with podiatry and wearing shoe inserts- with improvement of pain. Unable to walk long distances.  She also wanted to mention occasional cramping pelvic pain. menstruation has ceased. No vaginal bleeding, abdominal pain or distention, dyspareunia. 'not extreme'.     treated feet pain 01/2017 with gabapentin, nsaid; has sen podiatry since then  HISTORY:  Past Medical History:  Diagnosis Date  . Arthritis   . Asthma   . Colon polyps   . Hypertension    Past Surgical History:  Procedure Laterality Date  . COLONOSCOPY    . COLONOSCOPY WITH PROPOFOL N/A 01/07/2017   Procedure: COLONOSCOPY WITH PROPOFOL;  Surgeon: Lin Landsman, MD;  Location: [facility] ENDOSCOPY;  Service: Endoscopy;  Laterality: N/A;  . QUADRICEPS REPAIR    . TONSILLECTOMY    . tummy tuck     Family History  Problem Relation Age of Onset  . Arthritis Mother   . Heart disease Mother   . Arthritis Brother   . Heart disease Brother   . Hypertension Brother   . Cancer Brother        3 bothers melanoma  . Breast cancer Neg Hx     Allergies: Patient has no known allergies. Current Outpatient Medications on File Prior to Visit  Medication Sig Dispense Refill  . albuterol (PROVENTIL HFA;VENTOLIN HFA) 108 (90 Base) MCG/ACT inhaler Inhale 2 puffs into the lungs every 4 (four) hours as needed for wheezing or shortness of breath (cough, shortness of breath or wheezing.). 1 Inhaler 1  . calcium  citrate-vitamin D (CITRACAL+D) 315-200 MG-UNIT tablet Take 1 tablet by mouth 2 (two) times daily.    . Multiple Vitamin (MULTIVITAMIN) tablet Take 1 tablet by mouth daily.    . naproxen (NAPROSYN) 250 MG tablet Take by mouth 2 (two) times daily with a meal.    . SYMBICORT 80-4.5 MCG/ACT inhaler      No current facility-administered medications on file prior to visit.     Social History   Tobacco Use  . Smoking status: Never Smoker  . Smokeless tobacco: Never Used  Substance Use Topics  . Alcohol use: No  . Drug use: No    Review of Systems  Constitutional: Negative for chills and fever.  Respiratory: Negative for cough.   Cardiovascular: Negative for chest pain and palpitations.  Gastrointestinal: Negative for nausea and vomiting.  Genitourinary: Positive for pelvic pain. Negative for dyspareunia, frequency and vaginal bleeding.      Objective:    BP (!) 130/94 (BP Location: Left Arm, Patient Position: Sitting, Cuff Size: Large)   Pulse 73   Temp 99.2 F (37.3 C) (Oral)   Resp 16   Wt 298 lb 8 oz (135.4 kg)   SpO2 97%   BMI 49.67 kg/m  BP Readings from Last 3 Encounters:  08/02/17 (!) 130/94  07/08/17 110/74  06/24/17 140/82   Wt Readings from Last 3 Encounters:  08/02/17 298 lb  8 oz (135.4 kg)  07/08/17 298 lb 9.6 oz (135.4 kg)  06/24/17 295 lb 6.4 oz (134 kg)    Physical Exam  Constitutional: She appears well-developed and well-nourished.  Eyes: Conjunctivae are normal.  Cardiovascular: Normal rate, regular rhythm, normal heart sounds and normal pulses.  Pulmonary/Chest: Effort normal and breath sounds normal. She has no wheezes. She has no rhonchi. She has no rales.  Musculoskeletal:       Right hip: She exhibits tenderness. She exhibits normal range of motion, normal strength and no bony tenderness.       Right knee: She exhibits normal range of motion, no swelling and no effusion. No tenderness found.       Left knee: She exhibits normal range of motion,  no swelling and no effusion. No tenderness found.       Lumbar back: She exhibits normal range of motion, no tenderness and no bony tenderness.  Right Hip: No limp or waddling gait. Full ROM with flexion and hip rotation in flexion.    No pain of lateral hip with  (flexion-abduction-external rotation) test.   Mild pain with deep palpation of greater trochanter.    Neurological: She is alert.  Skin: Skin is warm and dry.  Psychiatric: She has a normal mood and affect. Her speech is normal and behavior is normal. Thought content normal.  Vitals reviewed.      Assessment & Plan:   Problem List Items Addressed This Visit      Musculoskeletal and Integument   Osteoarthritis - Primary    Symptoms consistent with OA. Hip pain c/w trochanteric bursitis. Trial mobic. Declines PT today. excercises described for patient.       Relevant Medications   meloxicam (MOBIC) 7.5 MG tablet     Other   Pelvic pain    No pain today. We jointly agreed to defer pelvic exam today in the absence of symptoms. Declines US pelvic. Patient will f/u with CPE and we will do pelvic exam at that time. She will let me know if symptom becomes more frequent or symptoms present.       Obesity    Referral to weight loss clinic.       Relevant Orders   Amb Ref to Medical Weight Management       I have discontinued Marie Solis. Solis's predniSONE, azithromycin, benzonatate, and doxycycline. I am also having her start on meloxicam. Additionally, I am having her maintain her SYMBICORT, multivitamin, calcium citrate-vitamin D, naproxen, and albuterol.   Meds ordered this encounter  Medications  . meloxicam (MOBIC) 7.5 MG tablet    Sig: Take 1 tablet (7.5 mg total) by mouth daily.    Dispense:  30 tablet    Refill:  0    Order Specific Question:   Supervising Provider    Answer:   Crecencio Mc [[id number]]    Return precautions given.   Risks, benefits, and alternatives of the medications and treatment plan  prescribed today were discussed, and patient expressed understanding.   Education regarding symptom management and diagnosis given to patient on AVS.  Continue to follow with Marie Hawthorne, FNP for routine health maintenance.   Marie Solis and I agreed with plan.   Marie Paris, FNP

## 2017-08-02 NOTE — Patient Instructions (Addendum)
Please return for physical exam so we can do pelvic exam  Marie Solis - In [employer]. Medical Weight Loss .   Heat for low back- knees, especially after days of a lot of  walking  Trial mobic  Suspect trochanteric bursitis    Trochanteric Bursitis Trochanteric bursitis is a condition that causes hip pain. Trochanteric bursitis happens when fluid-filled sacs (bursae) in the hip get irritated. Normally these sacs absorb shock and help strong bands of tissue (tendons) in your hip glide smoothly over each other and over your hip bones. What are the causes? This condition results from increased friction between the hip bones and the tendons that go over them. This condition can happen if you:  Have weak hips.  Use your hip muscles too much (overuse).  Get hit in the hip.  What increases the risk? This condition is more likely to develop in:  Women.  Adults who are middle-aged or older.  People with arthritis or a spinal condition.  People with weak buttocks muscles (gluteal muscles).  People who have one leg that is shorter than the other.  People who participate in certain kinds of athletic activities, such as: ? Running sports, especially long-distance running. ? Contact sports, like football or martial arts. ? Sports in which falls may occur, like skiing.  What are the signs or symptoms? The main symptom of this condition is pain and tenderness over the point of your hip. The pain may be:  Sharp and intense.  Dull and achy.  Felt on the outside of your thigh.  It may increase when you:  Lie on your side.  Walk or run.  Go up on stairs.  Sit.  Stand up after sitting.  Stand for long periods of time.  How is this diagnosed? This condition may be diagnosed based on:  Your symptoms.  Your medical history.  A physical exam.  Imaging tests, such as: ? X-rays to check your bones. ? An MRI or ultrasound to check your tendons and muscles.  During  your physical exam, your health care provider will check the movement and strength of your hip. He or she may press on the point of your hip to check for pain. How is this treated? This condition may be treated by:  Resting.  Reducing your activity.  Avoiding activities that cause pain.  Using crutches, a cane, or a walker to decrease the strain on your hip.  Taking medicine to help with swelling.  Having medicine injected into the bursae to help with swelling.  Using ice, heat, and massage therapy for pain relief.  Physical therapy exercises for strength and flexibility.  Surgery (rare).  Follow these instructions at home: Activity  Rest.  Avoid activities that cause pain.  Return to your normal activities as told by your health care provider. Ask your health care provider what activities are safe for you. Managing pain, stiffness, and swelling  Take over-the-counter and prescription medicines only as told by your health care provider.  If directed, apply heat to the injured area as told by your health care provider. ? Place a towel between your skin and the heat source. ? Leave the heat on for 20-30 minutes. ? Remove the heat if your skin turns bright red. This is especially important if you are unable to feel pain, heat, or cold. You may have a greater risk of getting burned.  If directed, apply ice to the injured area: ? Put ice in a plastic bag. ?  Place a towel between your skin and the bag. ? Leave the ice on for 20 minutes, 2-3 times a day. General instructions  If the affected leg is one that you use for driving, ask your health care provider when it is safe to drive.  Use crutches, a cane, or a walker as told by your health care provider.  If one of your legs is shorter than the other, get fitted for a shoe insert.  Lose weight if you are overweight. How is this prevented?  Wear supportive footwear that is appropriate for your sport.  If you have hip  pain, start any new exercise or sport slowly.  Maintain physical fitness, including: ? Strength. ? Flexibility. Contact a health care provider if:  Your pain does not improve with 2-4 weeks. Get help right away if:  You develop severe pain.  You have a fever.  You develop increased redness over your hip.  You have a change in your bowel function or bladder function.  You cannot control the muscles in your feet. This information is not intended to replace advice given to you by your health care provider. Make sure you discuss any questions you have with your health care provider. Document Released: 06/18/2004 Document Revised: 01/15/2016 Document Reviewed: 04/26/2015 Elsevier Interactive Patient Education  [employer].

## 2017-08-02 NOTE — Assessment & Plan Note (Addendum)
No pain today. We jointly agreed to defer pelvic exam today in the absence of symptoms. Declines US pelvic. Patient will f/u with CPE and we will do pelvic exam at that time. She will let me know if symptom becomes more frequent or symptoms present.

## 2017-08-02 NOTE — Assessment & Plan Note (Signed)
Referral to weight loss clinic.

## 2017-08-02 NOTE — Assessment & Plan Note (Signed)
Symptoms consistent with OA. Hip pain c/w trochanteric bursitis. Trial mobic. Declines PT today. excercises described for patient.

## 2017-08-26 ENCOUNTER — Encounter (INDEPENDENT_AMBULATORY_CARE_PROVIDER_SITE_OTHER): Payer: BC Managed Care – PPO

## 2017-08-31 ENCOUNTER — Ambulatory Visit (INDEPENDENT_AMBULATORY_CARE_PROVIDER_SITE_OTHER): Payer: BC Managed Care – PPO | Admitting: Family Medicine

## 2017-08-31 ENCOUNTER — Encounter (INDEPENDENT_AMBULATORY_CARE_PROVIDER_SITE_OTHER): Payer: Self-pay | Admitting: Family Medicine

## 2017-08-31 VITALS — BP 140/84 | HR 75 | Temp 98.5°F | Ht 65.0 in | Wt 300.0 lb

## 2017-08-31 DIAGNOSIS — Z1331 Encounter for screening for depression: Secondary | ICD-10-CM

## 2017-08-31 DIAGNOSIS — Z9189 Other specified personal risk factors, not elsewhere classified: Secondary | ICD-10-CM

## 2017-08-31 DIAGNOSIS — Z6841 Body Mass Index (BMI) 40.0 and over, adult: Secondary | ICD-10-CM | POA: Diagnosis not present

## 2017-08-31 DIAGNOSIS — R0602 Shortness of breath: Secondary | ICD-10-CM

## 2017-08-31 DIAGNOSIS — F3289 Other specified depressive episodes: Secondary | ICD-10-CM

## 2017-08-31 DIAGNOSIS — Z0289 Encounter for other administrative examinations: Secondary | ICD-10-CM

## 2017-08-31 DIAGNOSIS — E559 Vitamin D deficiency, unspecified: Secondary | ICD-10-CM

## 2017-08-31 DIAGNOSIS — R5383 Other fatigue: Secondary | ICD-10-CM | POA: Diagnosis not present

## 2017-08-31 NOTE — Progress Notes (Signed)
Office: [phone]  /  Fax: [phone]   Dear Marie Solis. Marie Schwalbe, FNP,   Thank you for referring Marie Solis to our clinic. The following note includes my evaluation and treatment recommendations.  HPI:   Chief Complaint: OBESITY    Marie Solis has been referred by Marie Solis. Marie Schwalbe, FNP for consultation regarding her obesity and obesity related comorbidities.    Marie Solis (MR# [id number]) is a 52 y.o. female who presents on 08/31/2017 for obesity evaluation and treatment. Current BMI is Body mass index is 49.92 kg/m.Marie Solis has been struggling with her weight for many years and has been unsuccessful in either losing weight, maintaining weight loss, or reaching her healthy weight goal.     Marie Solis attended our information session and states she is currently in the action stage of change and ready to dedicate time achieving and maintaining a healthier weight. Marie Solis is interested in becoming our patient and working on intensive lifestyle modifications including (but not limited to) diet, exercise and weight loss.    Marie Solis states her family eats meals together she struggles with family and or coworkers weight loss sabotage her desired weight loss is 115 lbs she has been heavy most of  her life she started gaining weight in elementary school her heaviest weight ever was 360 lbs. she has significant food cravings issues  she snacks frequently in the evenings she skips meals frequently she is frequently drinking liquids with calories she frequently makes poor food choices she has problems with excessive hunger  she frequently eats larger portions than normal  she has binge eating behaviors she struggles with emotional eating    Fatigue Marie Solis feels her energy is lower than it should be. This has worsened with weight gain and has not worsened recently. Marie Solis admits to daytime somnolence and admits to waking up still tired. Patient is at risk for  obstructive sleep apnea. Patent has a history of symptoms of daytime fatigue and morning fatigue. Patient generally gets 5 or 6 hours of sleep per night, and states they generally have restless sleep. Snoring is present. Apneic episodes are present. Epworth Sleepiness Score is 13  EKG was ordered today and was within normal limits.  Dyspnea on exertion Norvella notes increasing shortness of breath with exercising and seems to be worsening over time with weight gain. She notes getting out of breath sooner with activity than she used to. This has not gotten worse recently. EKG was ordered today and was within normal limits. Cris denies orthopnea.  Vitamin D deficiency Marie Solis has a previous diagnosis of vitamin D deficiency. She is currently taking OTC vit D and denies nausea, vomiting or muscle weakness.  At risk for osteopenia and osteoporosis Marie Solis is at higher risk of osteopenia and osteoporosis due to vitamin D deficiency.   Depression with emotional eating behaviors Marie Solis is struggling with emotional eating and using food for comfort to the extent that it is negatively impacting her health. She often snacks when she is not hungry. Marie Solis sometimes feels she is out of control and then feels guilty that she made poor food choices. She has been working on behavior modification techniques to help reduce her emotional eating and has been somewhat successful. She shows no sign of suicidal or homicidal ideations.  Depression screen [facility] 2/9 08/31/2017 02/24/2017 12/29/2016  Decreased Interest 2 0 0  Down, Depressed, Hopeless 2 0 0  PHQ - 2 Score 4 0 0  Altered sleeping 2 - -  Tired,  decreased energy 3 - -  Change in appetite 2 - -  Feeling bad or failure about yourself  3 - -  Trouble concentrating 0 - -  Moving slowly or fidgety/restless 2 - -  Suicidal thoughts 1 - -  PHQ-9 Score 17 - -  Difficult doing work/chores Somewhat difficult - -      Depression Screen Marie Solis's Food and Mood  (modified PHQ-9) score was  Depression screen PHQ 2/9 08/31/2017  Decreased Interest 2  Down, Depressed, Hopeless 2  PHQ - 2 Score 4  Altered sleeping 2  Tired, decreased energy 3  Change in appetite 2  Feeling bad or failure about yourself  3  Trouble concentrating 0  Moving slowly or fidgety/restless 2  Suicidal thoughts 1  PHQ-9 Score 17  Difficult doing work/chores Somewhat difficult    ALLERGIES: No Known Allergies  MEDICATIONS: Current Outpatient Medications on File Prior to Visit  Medication Sig Dispense Refill  . albuterol (PROVENTIL HFA;VENTOLIN HFA) 108 (90 Base) MCG/ACT inhaler Inhale 2 puffs into the lungs every 4 (four) hours as needed for wheezing or shortness of breath (cough, shortness of breath or wheezing.). 1 Inhaler 1  . calcium citrate-vitamin D (CITRACAL+D) 315-200 MG-UNIT tablet Take 1 tablet by mouth 2 (two) times daily.    . meloxicam (MOBIC) 7.5 MG tablet Take 1 tablet (7.5 mg total) by mouth daily. (Patient taking differently: Take 15 mg by mouth daily. ) 30 tablet 0  . Multiple Vitamin (MULTIVITAMIN) tablet Take 1 tablet by mouth daily.    . naproxen (NAPROSYN) 250 MG tablet Take by mouth 2 (two) times daily with a meal.    . SYMBICORT 80-4.5 MCG/ACT inhaler      No current facility-administered medications on file prior to visit.     PAST MEDICAL HISTORY: Past Medical History:  Diagnosis Date  . Arthritis   . Asthma   . Back pain   . Colon polyps   . Cough   . Hypertension   . Joint pain   . Numbness in both hands   . Pre-diabetes   . Rheumatoid aortitis   . Shortness of breath   . Trouble in sleeping   . Vitamin B12 deficiency     PAST SURGICAL HISTORY: Past Surgical History:  Procedure Laterality Date  . COLONOSCOPY    . COLONOSCOPY WITH PROPOFOL N/A 01/07/2017   Procedure: COLONOSCOPY WITH PROPOFOL;  Surgeon: Lin Landsman, MD;  Location: [facility] ENDOSCOPY;  Service: Endoscopy;  Laterality: N/A;  . DILATION AND CURETTAGE OF  UTERUS    . QUADRICEPS REPAIR    . TONSILLECTOMY    . tummy tuck      SOCIAL HISTORY: Social History   Tobacco Use  . Smoking status: Never Smoker  . Smokeless tobacco: Never Used  Substance Use Topics  . Alcohol use: No  . Drug use: No    FAMILY HISTORY: Family History  Problem Relation Age of Onset  . Arthritis Mother   . Heart disease Mother   . Hypertension Mother   . Depression Mother   . Eating disorder Mother   . Obesity Mother   . Arthritis Brother   . Heart disease Brother   . Hypertension Brother   . Cancer Brother        3 bothers melanoma  . Sudden death Father   . Breast cancer Neg Hx     ROS: Review of Systems  Constitutional: Positive for malaise/fatigue.  Eyes:  Wear Glasses or Contacts  Respiratory: Positive for cough and shortness of breath (on exertion).   Cardiovascular: Negative for orthopnea.  Gastrointestinal: Negative for nausea and vomiting.  Musculoskeletal: Positive for back pain.       Muscle or Joint Pain Negative for muscle weakness  Psychiatric/Behavioral: Positive for depression. Negative for suicidal ideas. The patient has insomnia.     PHYSICAL EXAM: Blood pressure 140/84, pulse 75, temperature 98.5 F (36.9 C), temperature source Oral, height 5\' 5"  (1.651 m), weight 300 lb (136.1 kg), SpO2 97 %. Body mass index is 49.92 kg/m. Physical Exam  Constitutional: She is oriented to person, place, and time. She appears well-developed and well-nourished.  HENT:  Head: Normocephalic and atraumatic.  Nose: Nose normal.  Eyes: EOM are normal. No scleral icterus.  Neck: Normal range of motion. Neck supple. No thyromegaly present.  Cardiovascular: Normal rate and regular rhythm.  Pulmonary/Chest: Effort normal. No respiratory distress.  Abdominal: Soft. There is no tenderness.  + obesity  Musculoskeletal: Normal range of motion.  Range of Motion normal in all 4 extremities  Neurological: She is alert and oriented to person,  place, and time. Coordination normal.  Skin: Skin is warm and dry.  Psychiatric: She has a normal mood and affect. Her behavior is normal.  Vitals reviewed.   RECENT LABS AND TESTS: BMET    Component Value Date/Time   NA 137 02/04/2017 0814   K 4.0 02/04/2017 0814   CL 106 02/04/2017 0814   CO2 24 02/04/2017 0814   GLUCOSE 93 02/04/2017 0814   BUN 16 02/04/2017 0814   CREATININE 0.78 02/04/2017 0814   CALCIUM 9.4 02/04/2017 0814   Lab Results  Component Value Date   HGBA1C 5.3 02/04/2017   No results found for: INSULIN CBC    Component Value Date/Time   WBC 4.8 02/04/2017 0814   RBC 4.35 02/04/2017 0814   HGB 12.0 02/04/2017 0814   HCT 37.6 02/04/2017 0814   PLT 254.0 02/04/2017 0814   MCV 86.4 02/04/2017 0814   MCHC 32.1 02/04/2017 0814   RDW 15.2 02/04/2017 0814   LYMPHSABS 1.5 02/04/2017 0814   MONOABS 0.5 02/04/2017 0814   EOSABS 0.4 02/04/2017 0814   BASOSABS 0.0 02/04/2017 0814   Iron/TIBC/Ferritin/ %Sat No results found for: IRON, TIBC, FERRITIN, IRONPCTSAT Lipid Panel     Component Value Date/Time   CHOL 163 02/04/2017 0814   TRIG 119.0 02/04/2017 0814   HDL 49.40 02/04/2017 0814   CHOLHDL 3 02/04/2017 0814   VLDL 23.8 02/04/2017 0814   LDLCALC 89 02/04/2017 0814   Hepatic Function Panel     Component Value Date/Time   PROT 6.8 02/04/2017 0814   ALBUMIN 4.0 02/04/2017 0814   AST 15 02/04/2017 0814   ALT 11 02/04/2017 0814   ALKPHOS 65 02/04/2017 0814   BILITOT 0.7 02/04/2017 0814      Component Value Date/Time   TSH 2.51 02/04/2017 0814    ECG  shows NSR with a rate of 70 BPM INDIRECT CALORIMETER done today shows a VO2 of 320 and a REE of 2226.  Her calculated basal metabolic rate is 6314 thus her basal metabolic rate is better than expected.    ASSESSMENT AND PLAN: Other fatigue - Plan: EKG 12-Lead, CBC with Differential/Platelet, Comprehensive metabolic panel, Hemoglobin A1c, Insulin, random, Lipid panel, T3, T4, free, TSH, Vitamin  B12, Folate  Shortness of breath on exertion  Vitamin D deficiency - Plan: VITAMIN D 25 Hydroxy (Vit-D Deficiency, Fractures)  Other depression -  with emotional eating  Screening for depression  At risk for osteoporosis  Class 3 severe obesity with serious comorbidity and body mass index (BMI) of 50.0 to 59.9 in adult, unspecified obesity type ([place])  PLAN: Fatigue Marie Solis was informed that her fatigue may be related to obesity, depression or many other causes. Labs will be ordered, and in the meanwhile Marie Solis has agreed to work on diet, exercise and weight loss to help with fatigue. Proper sleep hygiene was discussed including the need for 7-8 hours of quality sleep each night. A sleep study was not ordered based on symptoms and Epworth score. We will order indirect calorimetry.  Dyspnea on exertion Marie Solis's shortness of breath appears to be obesity related and exercise induced. She has agreed to work on weight loss and gradually increase exercise to treat her exercise induced shortness of breath. If Marie Solis follows our instructions and loses weight without improvement of her shortness of breath, we will plan to refer to pulmonology. We will order labs and indirect calorimetry. We will monitor this condition regularly. Marie Solis agrees to this plan.  Vitamin D Deficiency Marie Solis was informed that low vitamin D levels contributes to fatigue and are associated with obesity, breast, and colon cancer. She will continue to take OTC Vit D and we will check vitamin D level. She will follow up for routine testing of vitamin D, at least 2-3 times per year. She was informed of the risk of over-replacement of vitamin D and agrees to not increase her dose unless she discusses this with [place] first.  At risk for osteopenia and osteoporosis Marie Solis is at risk for osteopenia and osteoporosis due to her vitamin D deficiency. She was encouraged to take her vitamin D and follow her higher calcium diet and  increase strengthening exercise to help strengthen her bones and decrease her risk of osteopenia and osteoporosis.  Depression with Emotional Eating Behaviors We discussed behavior modification techniques today to help Marie Solis deal with her emotional eating and depression. She has agreed to start Lexapro 10 mg qd #30 with no refills and follow up as directed.  Depression Screen Marie Solis had a strongly positive depression screening. Depression is commonly associated with obesity and often results in emotional eating behaviors. We will monitor this closely and work on CBT to help improve the non-hunger eating patterns. Referral to Psychology may be required if no improvement is seen as she continues in our clinic.  Obesity Marie Solis is currently in the action stage of change and her goal is to continue with weight loss efforts. I recommend Marie Solis begin the structured treatment plan as follows:  She has agreed to follow the Category 4 plan Marie Solis has been instructed to eventually work up to a goal of 150 minutes of combined cardio and strengthening exercise per week for weight loss and overall health benefits. We discussed the following Behavioral Modification Strategies today: increasing lean protein intake, increasing vegetables and work on meal planning and easy cooking plans   She was informed of the importance of frequent follow up visits to maximize her success with intensive lifestyle modifications for her multiple health conditions. She was informed we would discuss her lab results at her next visit unless there is a critical issue that needs to be addressed sooner. Marie Solis agreed to keep her next visit at the agreed upon time to discuss these results.    OBESITY BEHAVIORAL INTERVENTION VISIT  Today's visit was # 1 out of 22.  Starting weight: 300 lbs Starting date: 08/31/17 Today's  weight : 300 lbs  Today's date: 08/31/2017 Total lbs lost to date: 0 (Patients must lose 7 lbs in the  first 6 months to continue with counseling)   ASK: We discussed the diagnosis of obesity with Karl Luke today and Gurpreet agreed to give [place] permission to discuss obesity behavioral modification therapy today.  ASSESS: Shauntay has the diagnosis of obesity and her BMI today is 49.92 Satonya is in the action stage of change   ADVISE: Ciin was educated on the multiple health risks of obesity as well as the benefit of weight loss to improve her health. She was advised of the need for long term treatment and the importance of lifestyle modifications.  AGREE: Multiple dietary modification options and treatment options were discussed and  Kamerin agreed to the above obesity treatment plan.   I, Doreene Nest, am acting as transcriptionist for  Eber Jones, MD   I have reviewed the above documentation for accuracy and completeness, and I agree with the above. - Ilene Qua, MD

## 2017-09-01 LAB — CBC WITH DIFFERENTIAL/PLATELET
BASOS: 1 %
Basophils Absolute: 0 10*3/uL (ref 0.0–0.2)
EOS (ABSOLUTE): 0.2 10*3/uL (ref 0.0–0.4)
Eos: 3 %
HEMOGLOBIN: 11.5 g/dL (ref 11.1–15.9)
Hematocrit: 35.3 % (ref 34.0–46.6)
IMMATURE GRANS (ABS): 0 10*3/uL (ref 0.0–0.1)
IMMATURE GRANULOCYTES: 0 %
LYMPHS: 29 %
Lymphocytes Absolute: 1.8 10*3/uL (ref 0.7–3.1)
MCH: 27.1 pg (ref 26.6–33.0)
MCHC: 32.6 g/dL (ref 31.5–35.7)
MCV: 83 fL (ref 79–97)
MONOCYTES: 6 %
Monocytes Absolute: 0.4 10*3/uL (ref 0.1–0.9)
NEUTROS ABS: 3.7 10*3/uL (ref 1.4–7.0)
NEUTROS PCT: 61 %
Platelets: 363 10*3/uL (ref 150–379)
RBC: 4.24 x10E6/uL (ref 3.77–5.28)
RDW: 15.9 % — ABNORMAL HIGH (ref 12.3–15.4)
WBC: 6.1 10*3/uL (ref 3.4–10.8)

## 2017-09-01 LAB — VITAMIN D 25 HYDROXY (VIT D DEFICIENCY, FRACTURES): Vit D, 25-Hydroxy: 21 ng/mL — ABNORMAL LOW (ref 30.0–100.0)

## 2017-09-01 LAB — HEMOGLOBIN A1C
Est. average glucose Bld gHb Est-mCnc: 108 mg/dL
Hgb A1c MFr Bld: 5.4 % (ref 4.8–5.6)

## 2017-09-01 LAB — COMPREHENSIVE METABOLIC PANEL
A/G RATIO: 1.5 (ref 1.2–2.2)
ALBUMIN: 4.1 g/dL (ref 3.5–5.5)
ALT: 15 IU/L (ref 0–32)
AST: 20 IU/L (ref 0–40)
Alkaline Phosphatase: 79 IU/L (ref 39–117)
BILIRUBIN TOTAL: 0.5 mg/dL (ref 0.0–1.2)
BUN / CREAT RATIO: 14 (ref 9–23)
BUN: 10 mg/dL (ref 6–24)
CALCIUM: 9.3 mg/dL (ref 8.7–10.2)
CHLORIDE: 100 mmol/L (ref 96–106)
CO2: 22 mmol/L (ref 20–29)
Creatinine, Ser: 0.72 mg/dL (ref 0.57–1.00)
GFR, EST AFRICAN AMERICAN: 111 mL/min/{1.73_m2} (ref >59)
GFR, EST NON AFRICAN AMERICAN: 97 mL/min/{1.73_m2} (ref >59)
GLUCOSE: 81 mg/dL (ref 65–99)
Globulin, Total: 2.7 g/dL (ref 1.5–4.5)
Potassium: 4.3 mmol/L (ref 3.5–5.2)
Sodium: 137 mmol/L (ref 134–144)
TOTAL PROTEIN: 6.8 g/dL (ref 6.0–8.5)

## 2017-09-01 LAB — LIPID PANEL
CHOL/HDL RATIO: 4.1 ratio (ref 0.0–4.4)
Cholesterol, Total: 183 mg/dL (ref 100–199)
HDL: 45 mg/dL (ref >39)
LDL Calculated: 97 mg/dL (ref 0–99)
Triglycerides: 204 mg/dL — ABNORMAL HIGH (ref 0–149)
VLDL Cholesterol Cal: 41 mg/dL — ABNORMAL HIGH (ref 5–40)

## 2017-09-01 LAB — T4, FREE: FREE T4: 1.07 ng/dL (ref 0.82–1.77)

## 2017-09-01 LAB — INSULIN, RANDOM: INSULIN: 11.7 u[IU]/mL (ref 2.6–24.9)

## 2017-09-01 LAB — T3: T3, Total: 105 ng/dL (ref 71–180)

## 2017-09-01 LAB — VITAMIN B12: Vitamin B-12: 314 pg/mL (ref 232–1245)

## 2017-09-01 LAB — FOLATE: Folate: 15.9 ng/mL (ref >3.0)

## 2017-09-01 LAB — TSH: TSH: 1.58 u[IU]/mL (ref 0.450–4.500)

## 2017-09-06 ENCOUNTER — Telehealth (INDEPENDENT_AMBULATORY_CARE_PROVIDER_SITE_OTHER): Payer: Self-pay | Admitting: Family Medicine

## 2017-09-06 NOTE — Telephone Encounter (Signed)
Called pt and spoke with her regarding the request for medication. Reviewed chart and did not see that there was documentation of rx to be sent in. Discussed with Dr Adair Patter as well. Advised pt that we would discuss with her the need for medication at her upcoming follow up. She said that she had spoken with the dietician about possibly something for depression and she was ok discussing this further when she comes in for her appt.  Remi Deter, CMA

## 2017-09-06 NOTE — Telephone Encounter (Signed)
Marie Solis was seen last week.  She stated that some prescriptions were to be sent to [facility] on [address] in [place] has told her they have not received anything for her.  Please send or advise. Thank you. Maudie Mercury

## 2017-09-13 ENCOUNTER — Ambulatory Visit (INDEPENDENT_AMBULATORY_CARE_PROVIDER_SITE_OTHER): Payer: BC Managed Care – PPO | Admitting: Family Medicine

## 2017-09-13 VITALS — BP 132/81 | HR 69 | Temp 98.0°F | Ht 65.0 in | Wt 295.0 lb

## 2017-09-13 DIAGNOSIS — Z9189 Other specified personal risk factors, not elsewhere classified: Secondary | ICD-10-CM

## 2017-09-13 DIAGNOSIS — E8881 Metabolic syndrome: Secondary | ICD-10-CM | POA: Diagnosis not present

## 2017-09-13 DIAGNOSIS — F3289 Other specified depressive episodes: Secondary | ICD-10-CM | POA: Diagnosis not present

## 2017-09-13 DIAGNOSIS — Z6841 Body Mass Index (BMI) 40.0 and over, adult: Secondary | ICD-10-CM

## 2017-09-13 DIAGNOSIS — E559 Vitamin D deficiency, unspecified: Secondary | ICD-10-CM | POA: Diagnosis not present

## 2017-09-13 DIAGNOSIS — E66813 Obesity, class 3: Secondary | ICD-10-CM

## 2017-09-13 DIAGNOSIS — E88819 Insulin resistance, unspecified: Secondary | ICD-10-CM

## 2017-09-13 MED ORDER — ESCITALOPRAM OXALATE 10 MG PO TABS: 10.0000 mg | ORAL_TABLET | Freq: Every day | ORAL | 0 refills | Status: DC

## 2017-09-13 MED ORDER — VITAMIN D (ERGOCALCIFEROL) 1.25 MG (50000 UNIT) PO CAPS: 50000.0000 [IU] | ORAL_CAPSULE | ORAL | 0 refills | Status: DC

## 2017-09-14 NOTE — Progress Notes (Signed)
Office: [phone]  /  Fax: [phone]   HPI:   Chief Complaint: OBESITY Marie Solis is here to discuss her progress with her obesity treatment plan. She is on the  follow the Category 4 plan and is following her eating plan approximately 50 % of the time. She states she is walking for 40 minutes 5 times per week. Marie Solis had a difficult time strictly following the plan secondary to work stressors. She still doesn't think she got all the protein in at dinner. She increased protein in the morning and at lunch.  Her weight is 295 lb (133.8 kg) today and has had a weight loss of 5 pounds over a period of 2 weeks since her last visit. She has lost 5 lbs since starting treatment with [place].  Vitamin D deficiency Marie Solis has a diagnosis of vitamin D deficiency. She is currently on Citracal +D, but she forgets to take it occasionally. Marie Solis denies nausea, vomiting or muscle weakness.  Insulin Resistance Marie Solis has a diagnosis of insulin resistance based on her elevated fasting insulin level of 11.7 Although Marie Solis's blood glucose readings are still under good control, insulin resistance puts her at greater risk of metabolic syndrome and diabetes. Her last A1c was at 5.4 She is not taking metformin currently and continues to work on diet and exercise to decrease risk of diabetes.  At risk for diabetes Terra is at higher than average risk for developing diabetes due to her obesity and insulin resistance. She currently denies polyuria or polydipsia.  Depression  Marie Solis has struggled with depression for years. Marie Solis struggles with emotional eating and using food for comfort to the extent that it is negatively impacting her health. She often snacks when she is not hungry. Marie Solis sometimes feels she is out of control and then feels guilty that she made poor food choices. She has been working on behavior modification techniques to help reduce her emotional eating and has been somewhat successful.  She shows no sign of suicidal or homicidal ideations.  Depression screen [facility] 2/9 08/31/2017 02/24/2017 12/29/2016  Decreased Interest 2 0 0  Down, Depressed, Hopeless 2 0 0  PHQ - 2 Score 4 0 0  Altered sleeping 2 - -  Tired, decreased energy 3 - -  Change in appetite 2 - -  Feeling bad or failure about yourself  3 - -  Trouble concentrating 0 - -  Moving slowly or fidgety/restless 2 - -  Suicidal thoughts 1 - -  PHQ-9 Score 17 - -  Difficult doing work/chores Somewhat difficult - -     ALLERGIES: No Known Allergies  MEDICATIONS: Current Outpatient Medications on File Prior to Visit  Medication Sig Dispense Refill  . albuterol (PROVENTIL HFA;VENTOLIN HFA) 108 (90 Base) MCG/ACT inhaler Inhale 2 puffs into the lungs every 4 (four) hours as needed for wheezing or shortness of breath (cough, shortness of breath or wheezing.). 1 Inhaler 1  . calcium citrate-vitamin D (CITRACAL+D) 315-200 MG-UNIT tablet Take 1 tablet by mouth 2 (two) times daily.    . meloxicam (MOBIC) 7.5 MG tablet Take 1 tablet (7.5 mg total) by mouth daily. (Patient taking differently: Take 15 mg by mouth daily. ) 30 tablet 0  . Multiple Vitamin (MULTIVITAMIN) tablet Take 1 tablet by mouth daily.    . naproxen (NAPROSYN) 250 MG tablet Take by mouth 2 (two) times daily with a meal.    . SYMBICORT 80-4.5 MCG/ACT inhaler      No current facility-administered medications on file prior  to visit.     PAST MEDICAL HISTORY: Past Medical History:  Diagnosis Date  . Arthritis   . Asthma   . Back pain   . Colon polyps   . Cough   . Hypertension   . Joint pain   . Numbness in both hands   . Pre-diabetes   . Rheumatoid aortitis   . Shortness of breath   . Trouble in sleeping   . Vitamin B12 deficiency     PAST SURGICAL HISTORY: Past Surgical History:  Procedure Laterality Date  . COLONOSCOPY    . COLONOSCOPY WITH PROPOFOL N/A 01/07/2017   Procedure: COLONOSCOPY WITH PROPOFOL;  Surgeon: Lin Landsman, MD;   Location: [facility] ENDOSCOPY;  Service: Endoscopy;  Laterality: N/A;  . DILATION AND CURETTAGE OF UTERUS    . QUADRICEPS REPAIR    . TONSILLECTOMY    . tummy tuck      SOCIAL HISTORY: Social History   Tobacco Use  . Smoking status: Never Smoker  . Smokeless tobacco: Never Used  Substance Use Topics  . Alcohol use: No  . Drug use: No    FAMILY HISTORY: Family History  Problem Relation Age of Onset  . Arthritis Mother   . Heart disease Mother   . Hypertension Mother   . Depression Mother   . Eating disorder Mother   . Obesity Mother   . Arthritis Brother   . Heart disease Brother   . Hypertension Brother   . Cancer Brother        3 bothers melanoma  . Sudden death Father   . Breast cancer Neg Hx     ROS: Review of Systems  Constitutional: Positive for weight loss.  Gastrointestinal: Negative for nausea and vomiting.  Genitourinary: Negative for frequency.  Musculoskeletal:       Negative for muscle weakness  Endo/Heme/Allergies: Negative for polydipsia.  Psychiatric/Behavioral: Positive for depression. Negative for suicidal ideas.    PHYSICAL EXAM: Blood pressure 132/81, pulse 69, temperature 98 F (36.7 C), temperature source Oral, height 5\' 5"  (1.651 m), weight 295 lb (133.8 kg), SpO2 97 %. Body mass index is 49.09 kg/m. Physical Exam  Constitutional: She is oriented to person, place, and time. She appears well-developed and well-nourished.  Cardiovascular: Normal rate.  Pulmonary/Chest: Effort normal.  Musculoskeletal: Normal range of motion.  Neurological: She is oriented to person, place, and time.  Skin: Skin is warm and dry.  Psychiatric: She has a normal mood and affect. Her behavior is normal.  Vitals reviewed.   RECENT LABS AND TESTS: BMET    Component Value Date/Time   NA 137 08/31/2017 1229   K 4.3 08/31/2017 1229   CL 100 08/31/2017 1229   CO2 22 08/31/2017 1229   GLUCOSE 81 08/31/2017 1229   GLUCOSE 93 02/04/2017 0814   BUN 10  08/31/2017 1229   CREATININE 0.72 08/31/2017 1229   CALCIUM 9.3 08/31/2017 1229   GFRNONAA 97 08/31/2017 1229   GFRAA 111 08/31/2017 1229   Lab Results  Component Value Date   HGBA1C 5.4 08/31/2017   HGBA1C 5.3 02/04/2017   Lab Results  Component Value Date   INSULIN 11.7 08/31/2017   CBC    Component Value Date/Time   WBC 6.1 08/31/2017 1229   WBC 4.8 02/04/2017 0814   RBC 4.24 08/31/2017 1229   RBC 4.35 02/04/2017 0814   HGB 11.5 08/31/2017 1229   HCT 35.3 08/31/2017 1229   PLT 363 08/31/2017 1229   MCV 83 08/31/2017 1229  MCH 27.1 08/31/2017 1229   MCHC 32.6 08/31/2017 1229   MCHC 32.1 02/04/2017 0814   RDW 15.9 (H) 08/31/2017 1229   LYMPHSABS 1.8 08/31/2017 1229   MONOABS 0.5 02/04/2017 0814   EOSABS 0.2 08/31/2017 1229   BASOSABS 0.0 08/31/2017 1229   Iron/TIBC/Ferritin/ %Sat No results found for: IRON, TIBC, FERRITIN, IRONPCTSAT Lipid Panel     Component Value Date/Time   CHOL 183 08/31/2017 1229   TRIG 204 (H) 08/31/2017 1229   HDL 45 08/31/2017 1229   CHOLHDL 4.1 08/31/2017 1229   CHOLHDL 3 02/04/2017 0814   VLDL 23.8 02/04/2017 0814   LDLCALC 97 08/31/2017 1229   Hepatic Function Panel     Component Value Date/Time   PROT 6.8 08/31/2017 1229   ALBUMIN 4.1 08/31/2017 1229   AST 20 08/31/2017 1229   ALT 15 08/31/2017 1229   ALKPHOS 79 08/31/2017 1229   BILITOT 0.5 08/31/2017 1229      Component Value Date/Time   TSH 1.580 08/31/2017 1229   TSH 2.51 02/04/2017 0814   Results for Lexany, [place] Cayli DIBBLE (MRN [id number]) as of 09/14/2017 09:46  Ref. Range 08/31/2017 12:29  Vitamin D, 25-Hydroxy Latest Ref Range: 30.0 - 100.0 ng/mL 21.0 (L)   ASSESSMENT AND PLAN: Vitamin D deficiency - Plan: Vitamin D, Ergocalciferol, (DRISDOL) 50000 units CAPS capsule  Insulin resistance  Other depression - Plan: escitalopram (LEXAPRO) 10 MG tablet  At risk for diabetes mellitus  Class 3 severe obesity with serious comorbidity and body mass index (BMI) of  45.0 to 49.9 in adult, unspecified obesity type ([place])  PLAN:  Vitamin D Deficiency Brandelyn was informed that low vitamin D levels contributes to fatigue and are associated with obesity, breast, and colon cancer. She agrees to start to take prescription Vit D @50 ,000 IU every week #4 with no refills and will follow up for routine testing of vitamin D, at least 2-3 times per year. She was informed of the risk of over-replacement of vitamin D and agrees to not increase her dose unless she discusses this with [place] first. Shaleen agrees to follow up with our clinic in 2 weeks.  Insulin Resistance Sanela will continue to work on weight loss, exercise, and decreasing simple carbohydrates in her diet to help decrease the risk of diabetes.  She was informed that eating too many simple carbohydrates or too many calories at one sitting increases the likelihood of GI side effects. Khalia will continue with the category 4 plan. Aaradhya agreed to follow up with [place] as directed to monitor her progress.  Diabetes risk counseling Rorie was given extended (30 minutes) diabetes prevention counseling today. She is 52 y.o. female and has risk factors for diabetes including obesity and insulin resistance. We discussed intensive lifestyle modifications today with an emphasis on weight loss as well as increasing exercise and decreasing simple carbohydrates in her diet.  Depression  We discussed behavior modification techniques today to help Monquie deal with her emotional eating and depression. She has agreed to start to take Lexapro 10 mg qd #30 with no refills and follow up as directed.  Obesity Aubryn is currently in the action stage of change. As such, her goal is to continue with weight loss efforts She has agreed to follow the Category 4 plan Jerzie has been instructed to work up to a goal of 150 minutes of combined cardio and strengthening exercise per week for weight loss and overall health benefits. We  discussed the following Behavioral Modification Strategies today: better snacking  choices, increasing lean protein intake, decreasing simple carbohydrates , increasing vegetables and work on meal planning and easy cooking plans  Yetta has agreed to follow up with our clinic in 2 weeks. She was informed of the importance of frequent follow up visits to maximize her success with intensive lifestyle modifications for her multiple health conditions.   OBESITY BEHAVIORAL INTERVENTION VISIT  Today's visit was # 2 out of 22.  Starting weight: 300 lbs Starting date: 08/31/17 Today's weight :  295 lbs Today's date: 09/13/2017 Total lbs lost to date: 5 (Patients must lose 7 lbs in the first 6 months to continue with counseling)   ASK: We discussed the diagnosis of obesity with Karl Luke today and Donnarae agreed to give [place] permission to discuss obesity behavioral modification therapy today.  ASSESS: Aissata has the diagnosis of obesity and her BMI today is 49.09 Lasonja is in the action stage of change   ADVISE: Sharan was educated on the multiple health risks of obesity as well as the benefit of weight loss to improve her health. She was advised of the need for long term treatment and the importance of lifestyle modifications.  AGREE: Multiple dietary modification options and treatment options were discussed and  Dasia agreed to the above obesity treatment plan.  I, Doreene Nest, am acting as transcriptionist for Eber Jones, MD  I have reviewed the above documentation for accuracy and completeness, and I agree with the above. - Ilene Qua, MD

## 2017-09-20 ENCOUNTER — Ambulatory Visit (INDEPENDENT_AMBULATORY_CARE_PROVIDER_SITE_OTHER): Payer: BC Managed Care – PPO | Admitting: Family Medicine

## 2017-09-29 ENCOUNTER — Ambulatory Visit (INDEPENDENT_AMBULATORY_CARE_PROVIDER_SITE_OTHER): Payer: BC Managed Care – PPO | Admitting: Family Medicine

## 2017-09-29 VITALS — BP 122/80 | HR 61 | Temp 98.3°F | Ht 65.0 in | Wt 289.0 lb

## 2017-09-29 DIAGNOSIS — E559 Vitamin D deficiency, unspecified: Secondary | ICD-10-CM

## 2017-09-29 DIAGNOSIS — Z6841 Body Mass Index (BMI) 40.0 and over, adult: Secondary | ICD-10-CM

## 2017-09-29 DIAGNOSIS — F3289 Other specified depressive episodes: Secondary | ICD-10-CM

## 2017-09-30 NOTE — Progress Notes (Signed)
Office: [phone]  /  Fax: [phone]   HPI:   Chief Complaint: OBESITY Marie Solis is here to discuss her progress with her obesity treatment plan. She is on the Category 4 plan and is following her eating plan approximately 75 % of the time. She states she is walking for 40 minutes 3 times per week. Marie Solis has occasional hunger, making choices to get protein in. Getting in 8 oz of meat at dinner.  Her weight is 289 lb (131.1 kg) today and has had a weight loss of 6 pounds over a period of 2 weeks since her last visit. She has lost 11 lbs since starting treatment with [place].  Vitamin D Deficiency Marie Solis has a diagnosis of vitamin D deficiency. She is currently taking prescription Vit D and denies nausea, vomiting or muscle weakness.  Depression with emotional eating behaviors Marie Solis is struggling with emotional eating and using food for comfort to the extent that it is negatively impacting her health. She often snacks when she is not hungry. Marie Solis sometimes feels she is out of control and then feels guilty that she made poor food choices. She has been working on behavior modification techniques to help reduce her emotional eating and has been somewhat successful. She shows no sign of suicidal or homicidal ideations.  Depression screen Marie Solis 2/9 08/31/2017 02/24/2017 12/29/2016  Decreased Interest 2 0 0  Down, Depressed, Hopeless 2 0 0  PHQ - 2 Score 4 0 0  Altered sleeping 2 - -  Tired, decreased energy 3 - -  Change in appetite 2 - -  Feeling bad or failure about yourself  3 - -  Trouble concentrating 0 - -  Moving slowly or fidgety/restless 2 - -  Suicidal thoughts 1 - -  PHQ-9 Score 17 - -  Difficult doing work/chores Somewhat difficult - -   ALLERGIES: No Known Allergies  MEDICATIONS: Current Outpatient Medications on File Prior to Visit  Medication Sig Dispense Refill  . albuterol (PROVENTIL HFA;VENTOLIN HFA) 108 (90 Base) MCG/ACT inhaler Inhale 2 puffs into the lungs every  4 (four) hours as needed for wheezing or shortness of breath (cough, shortness of breath or wheezing.). 1 Inhaler 1  . calcium citrate-vitamin D (CITRACAL+D) 315-200 MG-UNIT tablet Take 1 tablet by mouth 2 (two) times daily.    Marland Kitchen escitalopram (LEXAPRO) 10 MG tablet Take 1 tablet (10 mg total) by mouth daily. 30 tablet 0  . meloxicam (MOBIC) 7.5 MG tablet Take 1 tablet (7.5 mg total) by mouth daily. (Patient taking differently: Take 15 mg by mouth daily. ) 30 tablet 0  . Multiple Vitamin (MULTIVITAMIN) tablet Take 1 tablet by mouth daily.    . naproxen (NAPROSYN) 250 MG tablet Take by mouth 2 (two) times daily with a meal.    . SYMBICORT 80-4.5 MCG/ACT inhaler     . Vitamin D, Ergocalciferol, (DRISDOL) 50000 units CAPS capsule Take 1 capsule (50,000 Units total) by mouth every 7 (seven) days. 4 capsule 0   No current facility-administered medications on file prior to visit.     PAST MEDICAL HISTORY: Past Medical History:  Diagnosis Date  . Arthritis   . Asthma   . Back pain   . Colon polyps   . Cough   . Hypertension   . Joint pain   . Numbness in both hands   . Pre-diabetes   . Rheumatoid aortitis   . Shortness of breath   . Trouble in sleeping   . Vitamin B12 deficiency  PAST SURGICAL HISTORY: Past Surgical History:  Procedure Laterality Date  . COLONOSCOPY    . COLONOSCOPY WITH PROPOFOL N/A 01/07/2017   Procedure: COLONOSCOPY WITH PROPOFOL;  Surgeon: Lin Landsman, MD;  Location: [facility] ENDOSCOPY;  Service: Endoscopy;  Laterality: N/A;  . DILATION AND CURETTAGE OF UTERUS    . QUADRICEPS REPAIR    . TONSILLECTOMY    . tummy tuck      SOCIAL HISTORY: Social History   Tobacco Use  . Smoking status: Never Smoker  . Smokeless tobacco: Never Used  Substance Use Topics  . Alcohol use: No  . Drug use: No    FAMILY HISTORY: Family History  Problem Relation Age of Onset  . Arthritis Mother   . Heart disease Mother   . Hypertension Mother   . Depression Mother    . Eating disorder Mother   . Obesity Mother   . Arthritis Brother   . Heart disease Brother   . Hypertension Brother   . Cancer Brother        3 bothers melanoma  . Sudden death Father   . Breast cancer Neg Hx     ROS: Review of Systems  Constitutional: Positive for weight loss.  Gastrointestinal: Negative for nausea and vomiting.  Musculoskeletal:       Negative muscle weakness  Psychiatric/Behavioral: Positive for depression. Negative for suicidal ideas.    PHYSICAL EXAM: Blood pressure 122/80, pulse 61, temperature 98.3 F (36.8 C), temperature source Oral, height 5\' 5"  (1.651 m), weight 289 lb (131.1 kg), SpO2 97 %. Body mass index is 48.09 kg/m. Physical Exam  Constitutional: She is oriented to person, place, and time. She appears well-developed and well-nourished.  Cardiovascular: Normal rate.  Pulmonary/Chest: Effort normal.  Musculoskeletal: Normal range of motion.  Neurological: She is oriented to person, place, and time.  Skin: Skin is warm and dry.  Psychiatric: She has a normal mood and affect. Her behavior is normal.  Vitals reviewed.   RECENT LABS AND TESTS: BMET    Component Value Date/Time   NA 137 08/31/2017 1229   K 4.3 08/31/2017 1229   CL 100 08/31/2017 1229   CO2 22 08/31/2017 1229   GLUCOSE 81 08/31/2017 1229   GLUCOSE 93 02/04/2017 0814   BUN 10 08/31/2017 1229   CREATININE 0.72 08/31/2017 1229   CALCIUM 9.3 08/31/2017 1229   GFRNONAA 97 08/31/2017 1229   GFRAA 111 08/31/2017 1229   Lab Results  Component Value Date   HGBA1C 5.4 08/31/2017   HGBA1C 5.3 02/04/2017   Lab Results  Component Value Date   INSULIN 11.7 08/31/2017   CBC    Component Value Date/Time   WBC 6.1 08/31/2017 1229   WBC 4.8 02/04/2017 0814   RBC 4.24 08/31/2017 1229   RBC 4.35 02/04/2017 0814   HGB 11.5 08/31/2017 1229   HCT 35.3 08/31/2017 1229   PLT 363 08/31/2017 1229   MCV 83 08/31/2017 1229   MCH 27.1 08/31/2017 1229   MCHC 32.6 08/31/2017 1229     MCHC 32.1 02/04/2017 0814   RDW 15.9 (H) 08/31/2017 1229   LYMPHSABS 1.8 08/31/2017 1229   MONOABS 0.5 02/04/2017 0814   EOSABS 0.2 08/31/2017 1229   BASOSABS 0.0 08/31/2017 1229   Iron/TIBC/Ferritin/ %Sat No results found for: IRON, TIBC, FERRITIN, IRONPCTSAT Lipid Panel     Component Value Date/Time   CHOL 183 08/31/2017 1229   TRIG 204 (H) 08/31/2017 1229   HDL 45 08/31/2017 1229   CHOLHDL 4.1 08/31/2017  [place] 3 02/04/2017 0814   VLDL 23.8 02/04/2017 0814   LDLCALC 97 08/31/2017 1229   Hepatic Function Panel     Component Value Date/Time   PROT 6.8 08/31/2017 1229   ALBUMIN 4.1 08/31/2017 1229   AST 20 08/31/2017 1229   ALT 15 08/31/2017 1229   ALKPHOS 79 08/31/2017 1229   BILITOT 0.5 08/31/2017 1229      Component Value Date/Time   TSH 1.580 08/31/2017 1229   TSH 2.51 02/04/2017 0814  Results for Athanasia, [place] Deborh DIBBLE (MRN [id number]) as of 09/30/2017 11:57  Ref. Range 08/31/2017 12:29  Vitamin D, 25-Hydroxy Latest Ref Range: 30.0 - 100.0 ng/mL 21.0 (L)    ASSESSMENT AND PLAN: Vitamin D deficiency  Other depression - with emotional eating  Class 3 severe obesity with serious comorbidity and body mass index (BMI) of 45.0 to 49.9 in adult, unspecified obesity type (Marie Solis)  PLAN:  Vitamin D Deficiency Marie Solis was informed that low vitamin D levels contributes to fatigue and are associated with obesity, breast, and colon cancer. Marie Solis agrees to continue taking prescription Vit D @50 ,000 IU every week, no refill needed. She will follow up for routine testing of vitamin D, at least 2-3 times per year. She was informed of the risk of over-replacement of vitamin D and agrees to not increase her dose unless she discusses this with [place] first. Marie Solis agrees to follow up with our clinic in 2 weeks.  Depression with Emotional Eating Behaviors We discussed behavior modification techniques today to help Marie Solis deal with her emotional eating and depression. Marie Solis  agrees to continue taking Lexapro 10 mg PO daily, no refill needed. Marie Solis agrees to follow up with our clinic in 2 weeks.  We spent > than 50% of the 15 minute visit on the counseling as documented in the note.  Obesity Marie Solis is currently in the action stage of change. As such, her goal is to continue with weight loss efforts She has agreed to follow the Category 4 plan Marie Solis has been instructed to work up to a goal of 150 minutes of combined cardio and strengthening exercise per week for weight loss and overall health benefits. We discussed the following Behavioral Modification Strategies today: increasing lean protein intake, increasing vegetables, work on meal planning and easy cooking plans, better snacking choices, and planning for success   Marie Solis has agreed to follow up with our clinic in 2 weeks. She was informed of the importance of frequent follow up visits to maximize her success with intensive lifestyle modifications for her multiple health conditions.   OBESITY BEHAVIORAL INTERVENTION VISIT  Today's visit was # 3 out of 22.  Starting weight: 300 lbs Starting date: 08/31/17 Today's weight : 289 lbs  Today's date: 09/29/2017 Total lbs lost to date: 11 (Patients must lose 7 lbs in the first 6 months to continue with counseling)   ASK: We discussed the diagnosis of obesity with Marie Solis today and Marie Solis agreed to give [place] permission to discuss obesity behavioral modification therapy today.  ASSESS: Vasiliki has the diagnosis of obesity and her BMI today is 48.09 Sunny is in the action stage of change   ADVISE: Shabnam was educated on the multiple health risks of obesity as well as the benefit of weight loss to improve her health. She was advised of the need for long term treatment and the importance of lifestyle modifications.  AGREE: Multiple dietary modification options and treatment options were discussed and  Marie Solis agreed to  the above obesity  treatment plan.  I, Trixie Dredge, am acting as transcriptionist for Ilene Qua, MD  I have reviewed the above documentation for accuracy and completeness, and I agree with the above. - Ilene Qua, MD

## 2017-10-15 ENCOUNTER — Encounter (INDEPENDENT_AMBULATORY_CARE_PROVIDER_SITE_OTHER): Payer: Self-pay | Admitting: Family Medicine

## 2017-10-19 ENCOUNTER — Ambulatory Visit (INDEPENDENT_AMBULATORY_CARE_PROVIDER_SITE_OTHER): Payer: BC Managed Care – PPO | Admitting: Family Medicine

## 2017-10-19 ENCOUNTER — Encounter (INDEPENDENT_AMBULATORY_CARE_PROVIDER_SITE_OTHER): Payer: Self-pay

## 2017-10-19 NOTE — Telephone Encounter (Signed)
Please r/s patient.  Thank you very much

## 2017-10-28 ENCOUNTER — Encounter (INDEPENDENT_AMBULATORY_CARE_PROVIDER_SITE_OTHER): Payer: Self-pay | Admitting: Family Medicine

## 2017-11-03 ENCOUNTER — Encounter: Payer: Self-pay | Admitting: Family

## 2017-11-03 ENCOUNTER — Ambulatory Visit (INDEPENDENT_AMBULATORY_CARE_PROVIDER_SITE_OTHER): Payer: BC Managed Care – PPO | Admitting: Family

## 2017-11-03 VITALS — BP 126/76 | HR 78 | Temp 99.0°F | Ht 64.0 in | Wt 297.1 lb

## 2017-11-03 DIAGNOSIS — N920 Excessive and frequent menstruation with regular cycle: Secondary | ICD-10-CM | POA: Insufficient documentation

## 2017-11-03 DIAGNOSIS — Z0000 Encounter for general adult medical examination without abnormal findings: Secondary | ICD-10-CM

## 2017-11-03 DIAGNOSIS — J309 Allergic rhinitis, unspecified: Secondary | ICD-10-CM | POA: Diagnosis not present

## 2017-11-03 NOTE — Assessment & Plan Note (Signed)
Clinical breast exam performed.  Screening labs deferred as they recently ordered medical weight loss physician.

## 2017-11-03 NOTE — Patient Instructions (Addendum)
Plain mucinex for thick congestion Thin and runny- think flonase with an antihistamine ( zyrtec).   Since suspect allergies, start flonase and antihistamine  Let me know if no improvement.   Today we discussed referrals, orders. GYN for heavy bleeding   I have placed these orders in the system for you.  Please be sure to give [place] a call if you have not heard from our office regarding scheduling a test or regarding referral in a timely manner.  It is very important that you let me know as soon as possible.   Health Maintenance, Female Adopting a healthy lifestyle and getting preventive care can go a long way to promote health and wellness. Talk with your health care provider about what schedule of regular examinations is right for you. This is a good chance for you to check in with your provider about disease prevention and staying healthy. In between checkups, there are plenty of things you can do on your own. Experts have done a lot of research about which lifestyle changes and preventive measures are most likely to keep you healthy. Ask your health care provider for more information. Weight and diet Eat a healthy diet  Be sure to include plenty of vegetables, fruits, low-fat dairy products, and lean protein.  Do not eat a lot of foods high in solid fats, added sugars, or salt.  Get regular exercise. This is one of the most important things you can do for your health. ? Most adults should exercise for at least 150 minutes each week. The exercise should increase your heart rate and make you sweat (moderate-intensity exercise). ? Most adults should also do strengthening exercises at least twice a week. This is in addition to the moderate-intensity exercise.  Maintain a healthy weight  Body mass index (BMI) is a measurement that can be used to identify possible weight problems. It estimates body fat based on height and weight. Your health care provider can help determine your BMI and help you  achieve or maintain a healthy weight.  For females 72 years of age and older: ? A BMI below 18.5 is considered underweight. ? A BMI of 18.5 to 24.9 is normal. ? A BMI of 25 to 29.9 is considered overweight. ? A BMI of 30 and above is considered obese.  Watch levels of cholesterol and blood lipids  You should start having your blood tested for lipids and cholesterol at 53 years of age, then have this test every 5 years.  You may need to have your cholesterol levels checked more often if: ? Your lipid or cholesterol levels are high. ? You are older than 53 years of age. ? You are at high risk for heart disease.  Cancer screening Lung Cancer  Lung cancer screening is recommended for adults 22-54 years old who are at high risk for lung cancer because of a history of smoking.  A yearly low-dose CT scan of the lungs is recommended for people who: ? Currently smoke. ? Have quit within the past 15 years. ? Have at least a 30-pack-year history of smoking. A pack year is smoking an average of one pack of cigarettes a day for 1 year.  Yearly screening should continue until it has been 15 years since you quit.  Yearly screening should stop if you develop a health problem that would prevent you from having lung cancer treatment.  Breast Cancer  Practice breast self-awareness. This means understanding how your breasts normally appear and feel.  It  also means doing regular breast self-exams. Let your health care provider know about any changes, no matter how small.  If you are in your 20s or 30s, you should have a clinical breast exam (CBE) by a health care provider every 1-3 years as part of a regular health exam.  If you are 8 or older, have a CBE every year. Also consider having a breast X-ray (mammogram) every year.  If you have a family history of breast cancer, talk to your health care provider about genetic screening.  If you are at high risk for breast cancer, talk to your health  care provider about having an MRI and a mammogram every year.  Breast cancer gene (BRCA) assessment is recommended for women who have family members with BRCA-related cancers. BRCA-related cancers include: ? Breast. ? Ovarian. ? Tubal. ? Peritoneal cancers.  Results of the assessment will determine the need for genetic counseling and BRCA1 and BRCA2 testing.  Cervical Cancer Your health care provider may recommend that you be screened regularly for cancer of the pelvic organs (ovaries, uterus, and vagina). This screening involves a pelvic examination, including checking for microscopic changes to the surface of your cervix (Pap test). You may be encouraged to have this screening done every 3 years, beginning at age 26.  For women ages 58-65, health care providers may recommend pelvic exams and Pap testing every 3 years, or they may recommend the Pap and pelvic exam, combined with testing for human papilloma virus (HPV), every 5 years. Some types of HPV increase your risk of cervical cancer. Testing for HPV may also be done on women of any age with unclear Pap test results.  Other health care providers may not recommend any screening for nonpregnant women who are considered low risk for pelvic cancer and who do not have symptoms. Ask your health care provider if a screening pelvic exam is right for you.  If you have had past treatment for cervical cancer or a condition that could lead to cancer, you need Pap tests and screening for cancer for at least 20 years after your treatment. If Pap tests have been discontinued, your risk factors (such as having a new sexual partner) need to be reassessed to determine if screening should resume. Some women have medical problems that increase the chance of getting cervical cancer. In these cases, your health care provider may recommend more frequent screening and Pap tests.  Colorectal Cancer  This type of cancer can be detected and often  prevented.  Routine colorectal cancer screening usually begins at 53 years of age and continues through 53 years of age.  Your health care provider may recommend screening at an earlier age if you have risk factors for colon cancer.  Your health care provider may also recommend using home test kits to check for hidden blood in the stool.  A small camera at the end of a tube can be used to examine your colon directly (sigmoidoscopy or colonoscopy). This is done to check for the earliest forms of colorectal cancer.  Routine screening usually begins at age 18.  Direct examination of the colon should be repeated every 5-10 years through 53 years of age. However, you may need to be screened more often if early forms of precancerous polyps or small growths are found.  Skin Cancer  Check your skin from head to toe regularly.  Tell your health care provider about any new moles or changes in moles, especially if there is a change  in a mole's shape or color.  Also tell your health care provider if you have a mole that is larger than the size of a pencil eraser.  Always use sunscreen. Apply sunscreen liberally and repeatedly throughout the day.  Protect yourself by wearing long sleeves, pants, a wide-brimmed hat, and sunglasses whenever you are outside.  Heart disease, diabetes, and high blood pressure  High blood pressure causes heart disease and increases the risk of stroke. High blood pressure is more likely to develop in: ? People who have blood pressure in the high end of the normal range (130-139/85-89 mm Hg). ? People who are overweight or obese. ? People who are African American.  If you are 67-49 years of age, have your blood pressure checked every 3-5 years. If you are 41 years of age or older, have your blood pressure checked every year. You should have your blood pressure measured twice-once when you are at a hospital or clinic, and once when you are not at a hospital or clinic.  Record the average of the two measurements. To check your blood pressure when you are not at a hospital or clinic, you can use: ? An automated blood pressure machine at a pharmacy. ? A home blood pressure monitor.  If you are between 11 years and 39 years old, ask your health care provider if you should take aspirin to prevent strokes.  Have regular diabetes screenings. This involves taking a blood sample to check your fasting blood sugar level. ? If you are at a normal weight and have a low risk for diabetes, have this test once every three years after 53 years of age. ? If you are overweight and have a high risk for diabetes, consider being tested at a younger age or more often. Preventing infection Hepatitis B  If you have a higher risk for hepatitis B, you should be screened for this virus. You are considered at high risk for hepatitis B if: ? You were born in a country where hepatitis B is common. Ask your health care provider which countries are considered high risk. ? Your parents were born in a high-risk country, and you have not been immunized against hepatitis B (hepatitis B vaccine). ? You have HIV or AIDS. ? You use needles to inject street drugs. ? You live with someone who has hepatitis B. ? You have had sex with someone who has hepatitis B. ? You get hemodialysis treatment. ? You take certain medicines for conditions, including cancer, organ transplantation, and autoimmune conditions.  Hepatitis C  Blood testing is recommended for: ? Everyone born from [birth year] through 1966. ? Anyone with known risk factors for hepatitis C.  Sexually transmitted infections (STIs)  You should be screened for sexually transmitted infections (STIs) including gonorrhea and chlamydia if: ? You are sexually active and are younger than 53 years of age. ? You are older than 53 years of age and your health care provider tells you that you are at risk for this type of infection. ? Your sexual  activity has changed since you were last screened and you are at an increased risk for chlamydia or gonorrhea. Ask your health care provider if you are at risk.  If you do not have HIV, but are at risk, it may be recommended that you take a prescription medicine daily to prevent HIV infection. This is called pre-exposure prophylaxis (PrEP). You are considered at risk if: ? You are sexually active and do not regularly use  condoms or know the HIV status of your partner(s). ? You take drugs by injection. ? You are sexually active with a partner who has HIV.  Talk with your health care provider about whether you are at high risk of being infected with HIV. If you choose to begin PrEP, you should first be tested for HIV. You should then be tested every 3 months for as long as you are taking PrEP. Pregnancy  If you are premenopausal and you may become pregnant, ask your health care provider about preconception counseling.  If you may become pregnant, take 400 to 800 micrograms (mcg) of folic acid every day.  If you want to prevent pregnancy, talk to your health care provider about birth control (contraception). Osteoporosis and menopause  Osteoporosis is a disease in which the bones lose minerals and strength with aging. This can result in serious bone fractures. Your risk for osteoporosis can be identified using a bone density scan.  If you are 46 years of age or older, or if you are at risk for osteoporosis and fractures, ask your health care provider if you should be screened.  Ask your health care provider whether you should take a calcium or vitamin D supplement to lower your risk for osteoporosis.  Menopause may have certain physical symptoms and risks.  Hormone replacement therapy may reduce some of these symptoms and risks. Talk to your health care provider about whether hormone replacement therapy is right for you. Follow these instructions at home:  Schedule regular health, dental,  and eye exams.  Stay current with your immunizations.  Do not use any tobacco products including cigarettes, chewing tobacco, or electronic cigarettes.  If you are pregnant, do not drink alcohol.  If you are breastfeeding, limit how much and how often you drink alcohol.  Limit alcohol intake to no more than 1 drink per day for nonpregnant women. One drink equals 12 ounces of beer, 5 ounces of wine, or 1 ounces of hard liquor.  Do not use street drugs.  Do not share needles.  Ask your health care provider for help if you need support or information about quitting drugs.  Tell your health care provider if you often feel depressed.  Tell your health care provider if you have ever been abused or do not feel safe at home. This information is not intended to replace advice given to you by your health care provider. Make sure you discuss any questions you have with your health care provider. Document Released: 11/24/2010 Document Revised: 10/17/2015 Document Reviewed: 02/12/2015 Elsevier Interactive Patient Education  [employer].

## 2017-11-03 NOTE — Assessment & Plan Note (Signed)
Patient well-appearing and not particulary bothered by her symptoms.  We discussed conservative management with over-the-counter therapies at home.  Patient will try these medications and if no resolve, she will let me know.

## 2017-11-03 NOTE — Progress Notes (Signed)
Subjective:    Patient ID: Marie Solis, female    DOB: April 04, 1966, 52 y.o.   MRN: [id number]  CC: Marie Solis is a 52 y.o. female who presents today for physical exam.    HPI: Feels well today  Describes 'allergies' over past couple of weeks unchanged  Feels the need to cough to break up phlegm but then at end of day its clear, runny. Endorses post nasal drip. No fever, chills, ear or sinus pain.  Feels occasional wheeze, sob- 'more so in the morning' Packing up school right now to move to new school and suspects trigger. Using symbicort. Has a couple of times taken OTC cough medication. hasnt felt need to use albuterol inhaler.   Taking 50,000 units Vitamin d.    Colorectal Cancer Screening: UTD  Breast Cancer Screening: Mammogram UTD Cervical Cancer Screening: UTD Still has menses- notes every 3rd month or so has big blood clots, size of dollar paper. Endorses suprapubic cramping occasionally as well.  Bone Health screening/DEXA for 65+: No increased fracture risk. Defer screening at this time. Lung Cancer Screening: Doesn't have 30 year pack year history and age > 64 years. Immunizations       Tetanus - utd        Labs: Screening labs done with medical weight loss. Reviewed with patient. Low CVD risk.  Exercise: Gets regular exercise.  Alcohol use: none Smoking/tobacco use: Nonsmoker.  Regular dental exams: UTD Wears seat belt: Yes. Skin: family h/o melanoma; still following with dermatology. No personal h/o skin cancer HISTORY:  Past Medical History:  Diagnosis Date  . Arthritis   . Asthma   . Back pain   . Colon polyps   . Cough   . Hypertension   . Joint pain   . Numbness in both hands   . Pre-diabetes   . Rheumatoid aortitis   . Shortness of breath   . Trouble in sleeping   . Vitamin B12 deficiency     Past Surgical History:  Procedure Laterality Date  . COLONOSCOPY    . COLONOSCOPY WITH PROPOFOL N/A 01/07/2017   Procedure: COLONOSCOPY  WITH PROPOFOL;  Surgeon: Lin Landsman, MD;  Location: [facility] ENDOSCOPY;  Service: Endoscopy;  Laterality: N/A;  . DILATION AND CURETTAGE OF UTERUS    . QUADRICEPS REPAIR    . TONSILLECTOMY    . tummy tuck     Family History  Problem Relation Age of Onset  . Arthritis Mother   . Heart disease Mother   . Hypertension Mother   . Depression Mother   . Eating disorder Mother   . Obesity Mother   . Arthritis Brother   . Heart disease Brother   . Hypertension Brother   . Cancer Brother        3 bothers melanoma  . Sudden death Father   . Breast cancer Neg Hx       ALLERGIES: Patient has no known allergies.  Current Outpatient Medications on File Prior to Visit  Medication Sig Dispense Refill  . albuterol (PROVENTIL HFA;VENTOLIN HFA) 108 (90 Base) MCG/ACT inhaler Inhale 2 puffs into the lungs every 4 (four) hours as needed for wheezing or shortness of breath (cough, shortness of breath or wheezing.). 1 Inhaler 1  . calcium citrate-vitamin D (CITRACAL+D) 315-200 MG-UNIT tablet Take 1 tablet by mouth 2 (two) times daily.    Marland Kitchen escitalopram (LEXAPRO) 10 MG tablet Take 1 tablet (10 mg total) by mouth daily. 30 tablet 0  .  meloxicam (MOBIC) 7.5 MG tablet Take 1 tablet (7.5 mg total) by mouth daily. (Patient taking differently: Take 15 mg by mouth daily. ) 30 tablet 0  . Multiple Vitamin (MULTIVITAMIN) tablet Take 1 tablet by mouth daily.    . naproxen (NAPROSYN) 250 MG tablet Take by mouth 2 (two) times daily with a meal.    . SYMBICORT 80-4.5 MCG/ACT inhaler     . Vitamin D, Ergocalciferol, (DRISDOL) 50000 units CAPS capsule Take 1 capsule (50,000 Units total) by mouth every 7 (seven) days. 4 capsule 0   No current facility-administered medications on file prior to visit.     Social History   Tobacco Use  . Smoking status: Never Smoker  . Smokeless tobacco: Never Used  Substance Use Topics  . Alcohol use: No  . Drug use: No    Review of Systems  Constitutional: Negative for  chills, fever and unexpected weight change.  HENT: Positive for congestion and postnasal drip. Negative for ear pain, sinus pain and sore throat.   Respiratory: Positive for shortness of breath and wheezing. Negative for cough.   Cardiovascular: Negative for chest pain, palpitations and leg swelling.  Gastrointestinal: Negative for abdominal pain, nausea and vomiting.  Genitourinary: Positive for menstrual problem, pelvic pain and vaginal bleeding. Negative for dysuria, hematuria, vaginal discharge and vaginal pain.  Musculoskeletal: Negative for arthralgias and myalgias.  Skin: Negative for rash.  Neurological: Negative for headaches.  Hematological: Negative for adenopathy.  Psychiatric/Behavioral: Negative for confusion.      Objective:    BP 126/76 (BP Location: Left Arm, Patient Position: Sitting, Cuff Size: Large)   Pulse 78   Temp 99 F (37.2 C) (Oral)   Ht 5\' 4"  (1.626 m)   Wt 297 lb 2 oz (134.8 kg)   SpO2 98%   BMI 51.00 kg/m   BP Readings from Last 3 Encounters:  11/03/17 126/76  09/29/17 122/80  09/13/17 132/81   Wt Readings from Last 3 Encounters:  11/03/17 297 lb 2 oz (134.8 kg)  09/29/17 289 lb (131.1 kg)  09/13/17 295 lb (133.8 kg)    Physical Exam  Constitutional: She appears well-developed and well-nourished.  Eyes: Conjunctivae are normal.  Neck: No thyroid mass and no thyromegaly present.  Cardiovascular: Normal rate, regular rhythm, normal heart sounds and normal pulses.  Pulmonary/Chest: Effort normal and breath sounds normal. She has no wheezes. She has no rhonchi. She has no rales. Right breast exhibits no inverted nipple, no mass, no nipple discharge, no skin change and no tenderness. Left breast exhibits no inverted nipple, no mass, no nipple discharge, no skin change and no tenderness. Breasts are symmetrical.  CBE performed.   Lymphadenopathy:       Head (right side): No submental, no submandibular, no tonsillar, no preauricular, no posterior  auricular and no occipital adenopathy present.       Head (left side): No submental, no submandibular, no tonsillar, no preauricular, no posterior auricular and no occipital adenopathy present.    She has no cervical adenopathy.       Right cervical: No superficial cervical, no deep cervical and no posterior cervical adenopathy present.      Left cervical: No superficial cervical, no deep cervical and no posterior cervical adenopathy present.    She has no axillary adenopathy.  Neurological: She is alert.  Skin: Skin is warm and dry.  Psychiatric: She has a normal mood and affect. Her speech is normal and behavior is normal. Thought content normal.  Vitals reviewed.  Assessment & Plan:   Problem List Items Addressed This Visit      Respiratory   Allergic rhinitis    Patient well-appearing and not particulary bothered by her symptoms.  We discussed conservative management with over-the-counter therapies at home.  Patient will try these medications and if no resolve, she will let me know.        Other   Routine physical examination - Primary    Clinical breast exam performed.  Screening labs deferred as they recently ordered medical weight loss physician.      Menorrhagia with regular cycle    concern with description of size of blood clots, up to size of a dollar bill.  Discussed with patient she may need endometrial biopsy.  I would still refer to GYN regardless of pelvic exam today.  In that setting, we jointly agreed to defer pelvic exam today and refer to GYN.  Will follow      Relevant Orders   Ambulatory referral to Obstetrics / Gynecology       I am having Sindy Messing. Nemitz maintain her SYMBICORT, multivitamin, calcium citrate-vitamin D, naproxen, albuterol, meloxicam, Vitamin D (Ergocalciferol), and escitalopram.   No orders of the defined types were placed in this encounter.   Return precautions given.   Risks, benefits, and alternatives of the medications and  treatment plan prescribed today were discussed, and patient expressed understanding.   Education regarding symptom management and diagnosis given to patient on AVS.   Continue to follow with Burnard Hawthorne, FNP for routine health maintenance.   Karl Luke and I agreed with plan.   [name], FNP

## 2017-11-03 NOTE — Addendum Note (Signed)
Addended by: Johna Sheriff on: 11/03/2017 04:23 PM   Modules accepted: Orders

## 2017-11-03 NOTE — Addendum Note (Signed)
Addended by: Johna Sheriff on: 11/03/2017 04:20 PM   Modules accepted: Orders

## 2017-11-03 NOTE — Assessment & Plan Note (Signed)
concern with description of size of blood clots, up to size of a dollar bill.  Discussed with patient she may need endometrial biopsy.  I would still refer to GYN regardless of pelvic exam today.  In that setting, we jointly agreed to defer pelvic exam today and refer to GYN.  Will follow

## 2017-11-18 ENCOUNTER — Ambulatory Visit (INDEPENDENT_AMBULATORY_CARE_PROVIDER_SITE_OTHER): Payer: BC Managed Care – PPO | Admitting: Obstetrics and Gynecology

## 2017-11-18 ENCOUNTER — Encounter: Payer: Self-pay | Admitting: Obstetrics and Gynecology

## 2017-11-18 VITALS — BP 118/78 | HR 62 | Ht 63.0 in | Wt 286.0 lb

## 2017-11-18 DIAGNOSIS — N939 Abnormal uterine and vaginal bleeding, unspecified: Secondary | ICD-10-CM | POA: Diagnosis not present

## 2017-11-18 NOTE — Progress Notes (Signed)
Patient ID: Marie Solis, female   DOB: 08/14/1965, 52 y.o.   MRN: [id number]  Reason for Consult: Referral (menorrhagia)   Referred by Burnard Hawthorne, FNP  Subjective:     HPI:  Marie Solis is a 52 y.o. female. She presents today with complaints of heavy irregular menstrual bleeding.  Past Medical History:  Diagnosis Date  . Arthritis   . Asthma   . Back pain   . Colon polyps   . Cough   . Hypertension   . Joint pain   . Numbness in both hands   . Pre-diabetes   . Rheumatoid aortitis   . Shortness of breath   . Trouble in sleeping   . Vitamin B12 deficiency    Family History  Problem Relation Age of Onset  . Arthritis Mother   . Heart disease Mother   . Hypertension Mother   . Depression Mother   . Eating disorder Mother   . Obesity Mother   . Arthritis Brother   . Heart disease Brother   . Hypertension Brother   . Cancer Brother        3 bothers melanoma  . Sudden death Father   . Breast cancer Neg Hx    Past Surgical History:  Procedure Laterality Date  . COLONOSCOPY    . COLONOSCOPY WITH PROPOFOL N/A 01/07/2017   Procedure: COLONOSCOPY WITH PROPOFOL;  Surgeon: Lin Landsman, MD;  Location: [facility] ENDOSCOPY;  Service: Endoscopy;  Laterality: N/A;  . DILATION AND CURETTAGE OF UTERUS    . QUADRICEPS REPAIR    . TONSILLECTOMY    . tummy tuck      Short Social History:  Social History   Tobacco Use  . Smoking status: Never Smoker  . Smokeless tobacco: Never Used  Substance Use Topics  . Alcohol use: No    No Known Allergies  Current Outpatient Medications  Medication Sig Dispense Refill  . albuterol (PROVENTIL HFA;VENTOLIN HFA) 108 (90 Base) MCG/ACT inhaler Inhale 2 puffs into the lungs every 4 (four) hours as needed for wheezing or shortness of breath (cough, shortness of breath or wheezing.). 1 Inhaler 1  . calcium citrate-vitamin D (CITRACAL+D) 315-200 MG-UNIT tablet Take 1 tablet by mouth 2 (two) times daily.    Marland Kitchen  escitalopram (LEXAPRO) 10 MG tablet Take 1 tablet (10 mg total) by mouth daily. 30 tablet 0  . fluticasone (FLONASE) 50 MCG/ACT nasal spray   0  . Multiple Vitamin (MULTIVITAMIN) tablet Take 1 tablet by mouth daily.    . naproxen (NAPROSYN) 250 MG tablet Take by mouth 2 (two) times daily with a meal.    . SYMBICORT 80-4.5 MCG/ACT inhaler     . Vitamin D, Ergocalciferol, (DRISDOL) 50000 units CAPS capsule Take 1 capsule (50,000 Units total) by mouth every 7 (seven) days. 4 capsule 0   No current facility-administered medications for this visit.     Review of Systems  Constitutional: Negative for chills, fatigue, fever and unexpected weight change.  HENT: Negative for trouble swallowing.  Eyes: Negative for loss of vision.  Respiratory: Negative for cough, shortness of breath and wheezing.  Cardiovascular: Negative for chest pain, leg swelling, palpitations and syncope.  GI: Negative for abdominal pain, blood in stool, diarrhea, nausea and vomiting.  GU: Negative for difficulty urinating, dysuria, frequency and hematuria.  Musculoskeletal: Negative for back pain, leg pain and joint pain.  Skin: Negative for rash.  Neurological: Negative for dizziness, headaches, light-headedness, numbness and seizures.  Psychiatric:  Negative for behavioral problem, confusion, depressed mood and sleep disturbance.        Objective:  Objective   Vitals:   11/18/17 1331  BP: 118/78  Pulse: 62  Weight: 286 lb (129.7 kg)  Height: 5\' 3"  (1.6 m)   Body mass index is 50.66 kg/m.  Physical Exam  Constitutional: She is oriented to person, place, and time. She appears well-developed and well-nourished.  HENT:  Head: Normocephalic and atraumatic.  Eyes: EOM are normal.  Cardiovascular: Normal rate, regular rhythm and normal heart sounds.  Pulmonary/Chest: Effort normal and breath sounds normal.  Abdominal: Soft. She exhibits no distension and no mass. There is no tenderness. There is no guarding.   Genitourinary: Vagina normal and uterus normal.  Genitourinary Comments: Active passage of moderate amount of dark red blood and clot from the cervical os. No abnormalities, grossly normal cervix and vagina.   Neurological: She is alert and oriented to person, place, and time.  Skin: Skin is warm and dry.  Psychiatric: She has a normal mood and affect. Her behavior is normal. Judgment and thought content normal.  Nursing note and vitals reviewed.   Endometrial Biopsy After discussion with the patient regarding her abnormal uterine bleeding I recommended that she proceed with an endometrial biopsy for further diagnosis. The risks, benefits, alternatives, and indications for an endometrial biopsy were discussed with the patient in detail. She understood the risks including infection, bleeding, cervical laceration and uterine perforation.  Verbal consent was obtained.   PROCEDURE NOTE:  Pipelle endometrial biopsy was performed using aseptic technique with iodine preparation.  The uterus was sounded to a length of 7 cm.  Adequate sampling was obtained with minimal blood loss.  The patient tolerated the procedure well.  Disposition will be pending pathology.       Assessment/Plan:     Marie Solis is a 52 yo female in perimenopause with abnormal uterine bleeding 1. Endometrial biopsy performed today in office 2. Will have patient return in 2 week for an GYN [place]. Will base management off of [place] and biopsy results. She currently is managing with the bleeding and expressed that if this bleeding is normal part of menopause that will resolve with time she does not desire intervention.    Adrian Prows MD [facility] 11/18/17 2:05 PM

## 2017-11-23 LAB — PATHOLOGY

## 2017-11-23 NOTE — Progress Notes (Signed)
Negative, Released to mychart 

## 2017-12-01 ENCOUNTER — Ambulatory Visit: Payer: BC Managed Care – PPO | Admitting: Obstetrics and Gynecology

## 2017-12-01 ENCOUNTER — Other Ambulatory Visit: Payer: BC Managed Care – PPO

## 2017-12-09 ENCOUNTER — Ambulatory Visit: Payer: BC Managed Care – PPO | Admitting: Obstetrics and Gynecology

## 2017-12-09 ENCOUNTER — Other Ambulatory Visit: Payer: BC Managed Care – PPO

## 2017-12-15 ENCOUNTER — Encounter: Payer: Self-pay | Admitting: Obstetrics and Gynecology

## 2017-12-15 ENCOUNTER — Ambulatory Visit (INDEPENDENT_AMBULATORY_CARE_PROVIDER_SITE_OTHER): Payer: BC Managed Care – PPO | Admitting: Obstetrics and Gynecology

## 2017-12-15 ENCOUNTER — Ambulatory Visit (INDEPENDENT_AMBULATORY_CARE_PROVIDER_SITE_OTHER): Payer: BC Managed Care – PPO

## 2017-12-15 VITALS — BP 120/80 | Ht 64.0 in | Wt 278.0 lb

## 2017-12-15 DIAGNOSIS — N924 Excessive bleeding in the premenopausal period: Secondary | ICD-10-CM

## 2017-12-15 DIAGNOSIS — N939 Abnormal uterine and vaginal bleeding, unspecified: Secondary | ICD-10-CM

## 2017-12-15 DIAGNOSIS — N800 Endometriosis of uterus: Secondary | ICD-10-CM

## 2017-12-15 DIAGNOSIS — N8003 Adenomyosis of the uterus: Secondary | ICD-10-CM

## 2017-12-15 DIAGNOSIS — N809 Endometriosis, unspecified: Secondary | ICD-10-CM

## 2017-12-15 NOTE — Progress Notes (Signed)
Patient ID: Marie Solis, female   DOB: [birth year], 52 y.o.   MRN: [id number]  Reason for Consult: Follow-up and Menorrhagia   Referred by Burnard Hawthorne, FNP  Subjective:     HPI:  Marie Solis is a 52 y.o. female she is following up today for abnormal perimenopausal bleeding. She has had many years since her teens of heavy periods with painful menstrual cycles. She thought that her periods were stopping and had gone several months without a period,  but then she had another episode of very heavy vaginal bleeding. She came in to be evaluated for this. She underwent an endometrial biopsy which was normal.  Pap smear from 2018 was normal. Today and [place] was performed.   Past Medical History:  Diagnosis Date  . Arthritis   . Asthma   . Back pain   . Colon polyps   . Cough   . Hypertension   . Joint pain   . Numbness in both hands   . Pre-diabetes   . Rheumatoid aortitis   . Shortness of breath   . Trouble in sleeping   . Vitamin B12 deficiency    Family History  Problem Relation Age of Onset  . Arthritis Mother   . Heart disease Mother   . Hypertension Mother   . Depression Mother   . Eating disorder Mother   . Obesity Mother   . Arthritis Brother   . Heart disease Brother   . Hypertension Brother   . Cancer Brother        3 bothers melanoma  . Sudden death Father   . Breast cancer Neg Hx    Past Surgical History:  Procedure Laterality Date  . COLONOSCOPY    . COLONOSCOPY WITH PROPOFOL N/A 01/07/2017   Procedure: COLONOSCOPY WITH PROPOFOL;  Surgeon: Lin Landsman, MD;  Location: [facility] ENDOSCOPY;  Service: Endoscopy;  Laterality: N/A;  . DILATION AND CURETTAGE OF UTERUS    . QUADRICEPS REPAIR    . TONSILLECTOMY    . tummy tuck      Short Social History:  Social History   Tobacco Use  . Smoking status: Never Smoker  . Smokeless tobacco: Never Used  Substance Use Topics  . Alcohol use: No    No Known Allergies  Current Outpatient  Medications  Medication Sig Dispense Refill  . albuterol (PROVENTIL HFA;VENTOLIN HFA) 108 (90 Base) MCG/ACT inhaler Inhale 2 puffs into the lungs every 4 (four) hours as needed for wheezing or shortness of breath (cough, shortness of breath or wheezing.). 1 Inhaler 1  . calcium citrate-vitamin D (CITRACAL+D) 315-200 MG-UNIT tablet Take 1 tablet by mouth 2 (two) times daily.    Marland Kitchen escitalopram (LEXAPRO) 10 MG tablet Take 1 tablet (10 mg total) by mouth daily. 30 tablet 0  . fluticasone (FLONASE) 50 MCG/ACT nasal spray   0  . Multiple Vitamin (MULTIVITAMIN) tablet Take 1 tablet by mouth daily.    . naproxen (NAPROSYN) 250 MG tablet Take by mouth 2 (two) times daily with a meal.    . SYMBICORT 80-4.5 MCG/ACT inhaler     . Vitamin D, Ergocalciferol, (DRISDOL) 50000 units CAPS capsule Take 1 capsule (50,000 Units total) by mouth every 7 (seven) days. 4 capsule 0   No current facility-administered medications for this visit.     Review of Systems  Constitutional: Negative for chills, fatigue, fever and unexpected weight change.  HENT: Negative for trouble swallowing.  Eyes: Negative for loss of vision.  Respiratory: Negative for cough, shortness of breath and wheezing.  Cardiovascular: Negative for chest pain, leg swelling, palpitations and syncope.  GI: Negative for abdominal pain, blood in stool, diarrhea, nausea and vomiting.  GU: Negative for difficulty urinating, dysuria, frequency and hematuria.  Musculoskeletal: Negative for back pain, leg pain and joint pain.  Skin: Negative for rash.  Neurological: Negative for dizziness, headaches, light-headedness, numbness and seizures.  Psychiatric: Negative for behavioral problem, confusion, depressed mood and sleep disturbance.        Objective:  Objective   Vitals:   12/15/17 1420  BP: 120/80  Weight: 278 lb (126.1 kg)  Height: 5\' 4"  (1.626 m)   Body mass index is 47.72 kg/m.  Physical Exam  Constitutional: She is oriented to  person, place, and time. She appears well-developed and well-nourished.  HENT:  Head: Normocephalic and atraumatic.  Eyes: Pupils are equal, round, and reactive to light. EOM are normal.  Cardiovascular: Normal rate and regular rhythm.  Pulmonary/Chest: Effort normal. No respiratory distress.  Neurological: She is alert and oriented to person, place, and time.  Skin: Skin is warm and dry.  Psychiatric: She has a normal mood and affect. Her behavior is normal. Judgment and thought content normal.  Nursing note and vitals reviewed.   US Pelvis Transvanginal Non-ob (tv Only)  Result Date: 12/15/2017 ULTRASOUND REPORT Patient Name: Marie Solis DOB: 1965-07-16 MRN: [id number] Location: [facility] Date of Service: 12/15/2017 Indications:AUB Findings: The uterus is anteverted and measures 8.51 x 6.27 x 4.91cm. Echo texture is heterogenous without evidence of focal masses; suspicious for Adenomyosis The Endometrium measures 2.54 mm. Right Ovary measures 2.55 x 1.48 x 1.25 cm. It is normal in appearance. Left Ovary measures 1.94 x 1.33 x 1.89 cm. It is normal in appearance. Survey of the adnexa demonstrates no adnexal masses. There is no free fluid in the cul de sac. Impression: 1. Adenomyosis Recommendations: 1.Clinical correlation with the patient's History and Physical Exam. Edwena Bunde, RDMS, RVT I have reviewed this ultrasound and the report. I agree with the above assessment and plan. Adrian Prows MD [facility] 12/15/17 2:27 PM         Assessment/Plan:     52 yo  Adenomyosis and perimenopausal bleeding, no evidence of hyperplasia or malignancy at this time. Since her bleeding may continue to be heavy or irregular we can monitor her endometrial lining with yearly [place]. She does not desire treatment at this time. She was raised with a farm mentality and was told to " grin and bear it." She has a high pain tolerance because of this. She will return  if bleeding or pain is too difficult to manage. If hot flashed are bothersome she will return to discuss treatment options.   More than 15 minutes were spent face to face with the patient in the room with more than 50% of the time spent providing counseling and discussing the plan of management.    Adrian Prows MD [facility] 12/15/17 2:41 PM

## 2018-03-23 ENCOUNTER — Other Ambulatory Visit (HOSPITAL_COMMUNITY)
Admission: RE | Admit: 2018-03-23 | Discharge: 2018-03-23 | Disposition: A | Discharge status: Home / Self Care | Payer: BC Managed Care – PPO | Source: Ambulatory Visit | Attending: Family | Admitting: Family

## 2018-03-23 ENCOUNTER — Ambulatory Visit: Payer: BC Managed Care – PPO | Admitting: Family

## 2018-03-23 ENCOUNTER — Encounter: Payer: Self-pay | Admitting: Family

## 2018-03-23 VITALS — BP 140/80 | HR 86 | Temp 98.8°F | Resp 16 | Ht 64.0 in | Wt 299.1 lb

## 2018-03-23 DIAGNOSIS — N898 Other specified noninflammatory disorders of vagina: Secondary | ICD-10-CM | POA: Insufficient documentation

## 2018-03-23 DIAGNOSIS — R0981 Nasal congestion: Secondary | ICD-10-CM | POA: Diagnosis not present

## 2018-03-23 DIAGNOSIS — M2550 Pain in unspecified joint: Secondary | ICD-10-CM | POA: Diagnosis not present

## 2018-03-23 MED ORDER — DICLOFENAC SODIUM 1 % TD GEL: 4.0000 g | Freq: Four times a day (QID) | TRANSDERMAL | 3 refills | Status: DC

## 2018-03-23 NOTE — Patient Instructions (Addendum)
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

## 2018-03-23 NOTE — Progress Notes (Signed)
Subjective:    Patient ID: Marie Solis, female    DOB: [birth year], 52 y.o.   MRN: [id number]  CC: Marie Solis is a 52 y.o. female who presents today for an acute visit.    HPI: CC: vaginal itching x one month, worsening  Thinks symptoms may be due to vaginal dryness, which she has noticed during intercourse. Using vaseline without relief.  NO vaginal bleeding, pelvic pain.   No purulent discharge from vagina   NO concerns for STD.   Tried changing detergents without relief.   Pain in right and left pointer finger. No fever, heat in joints,swelling, numbness.  Would like to know if something she can do. Notices pain after writing. No swelling in wrists. Takes aleve prn with relief.   Complains noted over the past x 2 days,  mild nasal congestion.  It is not worsened.  She suspects it is viral.  No fever, chills, cough, wheezing, ha, vision changes.  Has been using Mucinex D with some relief.  Colonoscopy UTD       Normal pap, utd.   HISTORY:  Past Medical History:  Diagnosis Date  . Arthritis   . Asthma   . Back pain   . Colon polyps   . Cough   . Hypertension   . Joint pain   . Numbness in both hands   . Pre-diabetes   . Rheumatoid aortitis   . Shortness of breath   . Trouble in sleeping   . Vitamin B12 deficiency    Past Surgical History:  Procedure Laterality Date  . COLONOSCOPY    . COLONOSCOPY WITH PROPOFOL N/A 01/07/2017   Procedure: COLONOSCOPY WITH PROPOFOL;  Surgeon: Lin Landsman, MD;  Location: [facility] ENDOSCOPY;  Service: Endoscopy;  Laterality: N/A;  . DILATION AND CURETTAGE OF UTERUS    . QUADRICEPS REPAIR    . TONSILLECTOMY    . tummy tuck     Family History  Problem Relation Age of Onset  . Arthritis Mother   . Heart disease Mother   . Hypertension Mother   . Depression Mother   . Eating disorder Mother   . Obesity Mother   . Arthritis Brother   . Heart disease Brother   . Hypertension Brother   . Cancer Brother         3 bothers melanoma  . Sudden death Father   . Breast cancer Neg Hx     Allergies: Patient has no known allergies. Current Outpatient Medications on File Prior to Visit  Medication Sig Dispense Refill  . albuterol (PROVENTIL HFA;VENTOLIN HFA) 108 (90 Base) MCG/ACT inhaler Inhale 2 puffs into the lungs every 4 (four) hours as needed for wheezing or shortness of breath (cough, shortness of breath or wheezing.). 1 Inhaler 1  . calcium citrate-vitamin D (CITRACAL+D) 315-200 MG-UNIT tablet Take 1 tablet by mouth 2 (two) times daily.    . fluticasone (FLONASE) 50 MCG/ACT nasal spray   0  . Multiple Vitamin (MULTIVITAMIN) tablet Take 1 tablet by mouth daily.    . naproxen (NAPROSYN) 250 MG tablet Take by mouth 2 (two) times daily with a meal.    . SYMBICORT 80-4.5 MCG/ACT inhaler     . Vitamin D, Ergocalciferol, (DRISDOL) 50000 units CAPS capsule Take 1 capsule (50,000 Units total) by mouth every 7 (seven) days. 4 capsule 0   No current facility-administered medications on file prior to visit.     Social History   Tobacco Use  . Smoking  status: Never Smoker  . Smokeless tobacco: Never Used  Substance Use Topics  . Alcohol use: No  . Drug use: No    Review of Systems  Constitutional: Negative for chills and fever.  HENT: Positive for congestion and postnasal drip. Negative for sinus pressure, sore throat and trouble swallowing.   Respiratory: Negative for cough.   Cardiovascular: Negative for chest pain and palpitations.  Gastrointestinal: Negative for nausea and vomiting.  Genitourinary: Negative for dysuria, pelvic pain, vaginal bleeding, vaginal discharge and vaginal pain.  Musculoskeletal: Positive for arthralgias.      Objective:    BP 140/80 (BP Location: Left Arm, Patient Position: Sitting, Cuff Size: Normal)   Pulse 86   Temp 98.8 F (37.1 C) (Oral)   Resp 16   Ht 5\' 4"  (1.626 m)   Wt 299 lb 2 oz (135.7 kg)   SpO2 96%   BMI 51.34 kg/m    Physical Exam    Constitutional: She appears well-developed and well-nourished.  Eyes: Conjunctivae are normal.  Cardiovascular: Normal rate, regular rhythm, normal heart sounds and normal pulses.  Pulmonary/Chest: Effort normal and breath sounds normal. She has no wheezes. She has no rhonchi. She has no rales.  Abdominal: There is no CVA tenderness.  Genitourinary: There is no rash, tenderness or lesion on the right labia. There is no rash, tenderness or lesion on the left labia. Cervix exhibits no motion tenderness and no discharge. Right adnexum displays no mass, no tenderness and no fullness. Left adnexum displays no mass, no tenderness and no fullness. No erythema, tenderness or bleeding in the vagina. No foreign body in the vagina. No vaginal discharge found.  Genitourinary Comments: No vulvovaginal erythema. No lesions. Discharge is thin and clear, not purulent.   Musculoskeletal:       Right hand: She exhibits normal range of motion, no bony tenderness and no swelling. Decreased sensation noted. Normal strength noted.       Left hand: She exhibits normal range of motion, no tenderness, no bony tenderness and no swelling. Normal sensation noted. Normal strength noted.  Enlarged DIP joints of left and right first metacarpal . No effusion, increased warmth, erythema.   Neurological: She is alert.  Skin: Skin is warm and dry.  Psychiatric: She has a normal mood and affect. Her speech is normal and behavior is normal. Thought content normal.  Vitals reviewed.      Assessment & Plan:   Problem List Items Addressed This Visit      Genitourinary   Vaginal dryness - Primary    Suspect vaginitis , alternatively considering vaginal atrophy.  Discussed role of vaginal Estrace patient.  She will consider this in the future.  Pending wet prep.      Relevant Medications   diclofenac sodium (VOLTAREN) 1 % GEL   Other Relevant Orders   Cervicovaginal ancillary only     Other   Arthralgia    Suspect  osteoarthritis.  Advised heat, topical Voltaren gel.  Patient will let me know if no improvement.      Nasal congestion    Patient well-appearing.  Duration of symptoms 2 days.  Reasonable to continue conservative therapy at home for likely viral etiology.  Advised to use plain Mucinex as suspect Mucinex D elevating her blood pressure today.  Patient verbalized understanding and will let me know if symptoms not improved.            I have discontinued Adhira Jamil. Urwin's escitalopram. I am also having her  start on diclofenac sodium. Additionally, I am having her maintain her SYMBICORT, multivitamin, calcium citrate-vitamin D, naproxen, albuterol, Vitamin D (Ergocalciferol), and fluticasone.   Meds ordered this encounter  Medications  . diclofenac sodium (VOLTAREN) 1 % GEL    Sig: Apply 4 g topically 4 (four) times daily.    Dispense:  1 Tube    Refill:  3    Order Specific Question:   Supervising Provider    Answer:   Crecencio Mc [[id number]]    Return precautions given.   Risks, benefits, and alternatives of the medications and treatment plan prescribed today were discussed, and patient expressed understanding.   Education regarding symptom management and diagnosis given to patient on AVS.  Continue to follow with Burnard Hawthorne, FNP for routine health maintenance.   Karl Luke and I agreed with plan.   [name], FNP

## 2018-03-25 DIAGNOSIS — N898 Other specified noninflammatory disorders of vagina: Secondary | ICD-10-CM | POA: Insufficient documentation

## 2018-03-25 DIAGNOSIS — M2550 Pain in unspecified joint: Secondary | ICD-10-CM | POA: Insufficient documentation

## 2018-03-25 DIAGNOSIS — R0981 Nasal congestion: Secondary | ICD-10-CM | POA: Insufficient documentation

## 2018-03-25 LAB — CERVICOVAGINAL ANCILLARY ONLY: WET PREP (BD AFFIRM): NEGATIVE

## 2018-03-25 NOTE — Assessment & Plan Note (Signed)
Patient well-appearing.  Duration of symptoms 2 days.  Reasonable to continue conservative therapy at home for likely viral etiology.  Advised to use plain Mucinex as suspect Mucinex D elevating her blood pressure today.  Patient verbalized understanding and will let me know if symptoms not improved.

## 2018-03-25 NOTE — Assessment & Plan Note (Signed)
Suspect osteoarthritis.  Advised heat, topical Voltaren gel.  Patient will let me know if no improvement.

## 2018-03-25 NOTE — Assessment & Plan Note (Signed)
Suspect vaginitis , alternatively considering vaginal atrophy.  Discussed role of vaginal Estrace patient.  She will consider this in the future.  Pending wet prep.

## 2018-04-18 ENCOUNTER — Telehealth: Payer: Self-pay | Admitting: Family

## 2018-04-18 NOTE — Telephone Encounter (Unsigned)
Copied from [place] [id number]. Topic: Quick Communication - See Telephone Encounter >> Apr 18, 2018  3:35 PM Percell Belt A wrote: CRM for notification. See Telephone encounter for: 04/18/18.  Pt called in and stated that the diclofenac sodium (VOLTAREN) 1 % GEL [[id number]]  need a PA done it?  Have you rec'd this from the pharmacy and what is the [facility] ([address]), [place] [facility] [place] [phone] (Phone)  Best number  [phone]

## 2018-04-19 NOTE — Telephone Encounter (Signed)
Patient advised that prior authorization denied, due to per insurance company must be unable to tolearte  oral nonsteroidal  anti-inflammatory drugs NSAIDs.

## 2018-04-20 NOTE — Telephone Encounter (Signed)
Left voice mail for patient to call back ok for PEC to speak to patient , see below message    

## 2018-04-20 NOTE — Telephone Encounter (Signed)
Call pt Sorry not approved I would advise that she look for OTC topical for arthritic pain. Heat and/or ice can also be quite beneficial.  [employer] makes a good product which is OTC  Let me know if she would like referral to orthopedics for pain as well

## 2018-05-06 ENCOUNTER — Ambulatory Visit (INDEPENDENT_AMBULATORY_CARE_PROVIDER_SITE_OTHER): Payer: BC Managed Care – PPO

## 2018-05-06 ENCOUNTER — Ambulatory Visit: Payer: BC Managed Care – PPO | Admitting: Family

## 2018-05-06 ENCOUNTER — Encounter: Payer: Self-pay | Admitting: Family

## 2018-05-06 VITALS — BP 140/78 | HR 82 | Temp 97.9°F | Resp 19 | Ht 64.0 in | Wt 307.0 lb

## 2018-05-06 DIAGNOSIS — J069 Acute upper respiratory infection, unspecified: Secondary | ICD-10-CM

## 2018-05-06 DIAGNOSIS — F320 Major depressive disorder, single episode, mild: Secondary | ICD-10-CM | POA: Diagnosis not present

## 2018-05-06 DIAGNOSIS — J40 Bronchitis, not specified as acute or chronic: Secondary | ICD-10-CM

## 2018-05-06 DIAGNOSIS — H6643 Suppurative otitis media, unspecified, bilateral: Secondary | ICD-10-CM | POA: Diagnosis not present

## 2018-05-06 DIAGNOSIS — B9789 Other viral agents as the cause of diseases classified elsewhere: Secondary | ICD-10-CM | POA: Diagnosis not present

## 2018-05-06 MED ORDER — SYMBICORT 80-4.5 MCG/ACT IN AERO: 2.0000 | INHALATION_SPRAY | Freq: Two times a day (BID) | RESPIRATORY_TRACT | 3 refills | Status: DC

## 2018-05-06 MED ORDER — ALBUTEROL SULFATE HFA 108 (90 BASE) MCG/ACT IN AERS: 2.0000 | INHALATION_SPRAY | RESPIRATORY_TRACT | 1 refills | Status: DC | PRN

## 2018-05-06 MED ORDER — PREDNISONE 10 MG PO TABS: ORAL_TABLET | ORAL | 0 refills | Status: DC

## 2018-05-06 MED ORDER — ALBUTEROL SULFATE (2.5 MG/3ML) 0.083% IN NEBU
2.5000 mg | INHALATION_SOLUTION | Freq: Once | RESPIRATORY_TRACT | Status: AC
  Administered 2018-05-06: 2.5 mg via RESPIRATORY_TRACT

## 2018-05-06 MED ORDER — SERTRALINE HCL 50 MG PO TABS: 50.0000 mg | ORAL_TABLET | Freq: Every day | ORAL | 3 refills | Status: DC

## 2018-05-06 MED ORDER — AMOXICILLIN-POT CLAVULANATE 875-125 MG PO TABS: 1.0000 | ORAL_TABLET | Freq: Two times a day (BID) | ORAL | 0 refills | Status: AC

## 2018-05-06 NOTE — Assessment & Plan Note (Signed)
Trial of Zoloft.  Referral to counseling.  Close follow-up

## 2018-05-06 NOTE — Progress Notes (Signed)
Subjective:    Patient ID: Marie Solis, female    DOB: December 30, 1965, 52 y.o.   MRN: [id number]  CC: Marie Solis is a 51 y.o. female who presents today for an acute visit.    HPI: CC; coughing x 2 weeks, unchanged. Slow onset.    thinks she has asthma, though no formal diagnosis. Using symbicort however not using regularly. No cp, leg swelling.   Some wheezing. Walking whole school couple of times a day, 'with no problem.'   Nonsmoker.   Has been struggling more with depression. No Si/hi. No anxiety. Feels days are very full and pressure. Takes long time to fall asleep.  No hospitalizations for mental illness.  No history of suicide attempts.     HISTORY:  Past Medical History:  Diagnosis Date  . Arthritis   . Asthma   . Back pain   . Colon polyps   . Cough   . Hypertension   . Joint pain   . Numbness in both hands   . Pre-diabetes   . Rheumatoid aortitis   . Shortness of breath   . Trouble in sleeping   . Vitamin B12 deficiency    Past Surgical History:  Procedure Laterality Date  . COLONOSCOPY    . COLONOSCOPY WITH PROPOFOL N/A 01/07/2017   Procedure: COLONOSCOPY WITH PROPOFOL;  Surgeon: Lin Landsman, MD;  Location: [facility] ENDOSCOPY;  Service: Endoscopy;  Laterality: N/A;  . DILATION AND CURETTAGE OF UTERUS    . QUADRICEPS REPAIR    . TONSILLECTOMY    . tummy tuck     Family History  Problem Relation Age of Onset  . Arthritis Mother   . Heart disease Mother   . Hypertension Mother   . Depression Mother   . Eating disorder Mother   . Obesity Mother   . Arthritis Brother   . Heart disease Brother   . Hypertension Brother   . Cancer Brother        3 bothers melanoma  . Sudden death Father   . Breast cancer Neg Hx     Allergies: Patient has no known allergies. Current Outpatient Medications on File Prior to Visit  Medication Sig Dispense Refill  . calcium citrate-vitamin D (CITRACAL+D) 315-200 MG-UNIT tablet Take 1 tablet by mouth 2  (two) times daily.    . Multiple Vitamin (MULTIVITAMIN) tablet Take 1 tablet by mouth daily.    . naproxen (NAPROSYN) 250 MG tablet Take by mouth 2 (two) times daily with a meal.    . fluticasone (FLONASE) 50 MCG/ACT nasal spray   0   No current facility-administered medications on file prior to visit.     Social History   Tobacco Use  . Smoking status: Never Smoker  . Smokeless tobacco: Never Used  Substance Use Topics  . Alcohol use: No  . Drug use: No    Review of Systems  Constitutional: Negative for chills and fever.  HENT: Positive for congestion and ear pain. Negative for ear discharge, sore throat and trouble swallowing.   Respiratory: Positive for cough and wheezing. Negative for shortness of breath.   Cardiovascular: Negative for chest pain, palpitations and leg swelling.  Gastrointestinal: Negative for nausea and vomiting.  Psychiatric/Behavioral: Positive for sleep disturbance. Negative for self-injury and suicidal ideas. The patient is not nervous/anxious.       Objective:    BP 140/78 (BP Location: Left Arm, Patient Position: Sitting, Cuff Size: Large)   Pulse 82   Temp 97.9  F (36.6 C) (Oral)   Resp 19   Ht 5\' 4"  (1.626 m)   Wt (!) 307 lb (139.3 kg)   SpO2 95%   BMI 52.70 kg/m    Physical Exam Vitals signs reviewed.  Constitutional:      Appearance: She is well-developed.  HENT:     Head: Normocephalic and atraumatic.     Right Ear: Hearing, ear canal and external ear normal. No decreased hearing noted. No drainage, swelling or tenderness. A middle ear effusion is present. No foreign body. Tympanic membrane is erythematous and retracted.     Left Ear: Hearing, ear canal and external ear normal. No decreased hearing noted. No drainage, swelling or tenderness. A middle ear effusion is present. No foreign body. Tympanic membrane is erythematous and bulging.     Nose: Nose normal. No rhinorrhea.     Right Sinus: No maxillary sinus tenderness or frontal  sinus tenderness.     Left Sinus: No maxillary sinus tenderness or frontal sinus tenderness.     Mouth/Throat:     Pharynx: Uvula midline. No oropharyngeal exudate or posterior oropharyngeal erythema.     Tonsils: No tonsillar abscesses.  Eyes:     Conjunctiva/sclera: Conjunctivae normal.  Cardiovascular:     Rate and Rhythm: Regular rhythm.     Pulses: Normal pulses.     Heart sounds: Normal heart sounds.  Pulmonary:     Effort: Pulmonary effort is normal.     Breath sounds: Normal breath sounds. No wheezing, rhonchi or rales.  Lymphadenopathy:     Head:     Right side of head: No submental, submandibular, tonsillar, preauricular, posterior auricular or occipital adenopathy.     Left side of head: No submental, submandibular, tonsillar, preauricular, posterior auricular or occipital adenopathy.     Cervical: No cervical adenopathy.  Skin:    General: Skin is warm and dry.  Neurological:     Mental Status: She is alert.  Psychiatric:        Speech: Speech normal.        Behavior: Behavior normal.        Thought Content: Thought content normal.    Patient felt significantly better after albuterol treatment. Lung sounds clear and increased     Assessment & Plan:   Problem List Items Addressed This Visit      Respiratory   Bronchitis - Primary    No acute respiratory distress.  SaO2 95%.  Improved with nebulizer treatment.  Pending chest x-ray.  Treat with albuterol at home, Augmentin, and prednisone.  Patient will let me know if not better.      Relevant Medications   SYMBICORT 80-4.5 MCG/ACT inhaler   predniSONE (DELTASONE) 10 MG tablet   amoxicillin-clavulanate (AUGMENTIN) 875-125 MG tablet   albuterol (PROVENTIL HFA;VENTOLIN HFA) 108 (90 Base) MCG/ACT inhaler   albuterol (PROVENTIL) (2.5 MG/3ML) 0.083% nebulizer solution 2.5 mg (Completed)   Other Relevant Orders   DG Chest 2 View     Nervous and Auditory   Suppurative otitis media of both ears   Relevant  Medications   amoxicillin-clavulanate (AUGMENTIN) 875-125 MG tablet     Other   Depression, major, single episode, mild (HCC)    Trial of Zoloft.  Referral to counseling.  Close follow-up      Relevant Medications   sertraline (ZOLOFT) 50 MG tablet   Other Relevant Orders   Ambulatory referral to Psychology    Other Visit Diagnoses    Viral URI with cough  Relevant Medications   albuterol (PROVENTIL) (2.5 MG/3ML) 0.083% nebulizer solution 2.5 mg (Completed)        I have discontinued Marie Solis. Marie Solis Vitamin D (Ergocalciferol) and diclofenac sodium. I have also changed her SYMBICORT. Additionally, I am having her start on predniSONE, amoxicillin-clavulanate, and sertraline. Lastly, I am having her maintain her multivitamin, calcium citrate-vitamin D, naproxen, fluticasone, and albuterol. We administered albuterol.   Meds ordered this encounter  Medications  . SYMBICORT 80-4.5 MCG/ACT inhaler    Sig: Inhale 2 puffs into the lungs 2 (two) times daily.    Dispense:  1 Inhaler    Refill:  3    Order Specific Question:   Supervising Provider    Answer:   TULLO, TERESA L [[id number]]  . predniSONE (DELTASONE) 10 MG tablet    Sig: Take 40 mg by mouth on day 1, then taper 10 mg daily until gone    Dispense:  10 tablet    Refill:  0    Order Specific Question:   Supervising Provider    Answer:   Deborra Medina L [[id number]]  . amoxicillin-clavulanate (AUGMENTIN) 875-125 MG tablet    Sig: Take 1 tablet by mouth 2 (two) times daily for 7 days.    Dispense:  14 tablet    Refill:  0    Order Specific Question:   Supervising Provider    Answer:   Deborra Medina L [[id number]]  . albuterol (PROVENTIL HFA;VENTOLIN HFA) 108 (90 Base) MCG/ACT inhaler    Sig: Inhale 2 puffs into the lungs every 4 (four) hours as needed for wheezing or shortness of breath (cough, shortness of breath or wheezing.).    Dispense:  1 Inhaler    Refill:  1    Order Specific Question:   Supervising Provider    Answer:    Derrel Nip, TERESA L [[id number]]  . albuterol (PROVENTIL) (2.5 MG/3ML) 0.083% nebulizer solution 2.5 mg  . sertraline (ZOLOFT) 50 MG tablet    Sig: Take 1 tablet (50 mg total) by mouth at bedtime.    Dispense:  90 tablet    Refill:  3    Order Specific Question:   Supervising Provider    Answer:   Crecencio Mc [[id number]]    Return precautions given.   Risks, benefits, and alternatives of the medications and treatment plan prescribed today were discussed, and patient expressed understanding.   Education regarding symptom management and diagnosis given to patient on AVS.  Continue to follow with Burnard Hawthorne, FNP for routine health maintenance.   Karl Luke and I agreed with plan.   [name], FNP

## 2018-05-06 NOTE — Patient Instructions (Addendum)
Trial of Zoloft. Albuterol, Augmentin.  Please do not start prednisone until tomorrow as discussed. Ensure to take probiotics while on antibiotics and also for 2 weeks after completion. It is important to re-colonize the gut with good bacteria and also to prevent any diarrheal infections associated with antibiotic use.   Use albuterol every 6 hours for first 24 hours to get good medication into the lungs and loosen congestion; after, you may use as needed and eventually stop all together when cough resolves.  Let me know if not better.  Today we discussed referrals, orders. counseling   I have placed these orders in the system for you.  Please be sure to give [place] a call if you have not heard from our office regarding this. We should hear from [place] within ONE week with information regarding your appointment. If not, please let me know immediately.     Our hope is for gradual improvement of mood since starting medication ( zoloft); however this may take several weeks.   If you start to have unusual thoughts, thoughts of hurting yourself, or anyone else, please go immediately to the emergency department.   Follow up in 2 months   [facility] - available 24 hours a day, 7 days a week.  [phone]  Major Depressive Disorder Major depressive disorder is a mental illness. It also may be called clinical depression or unipolar depression. Major depressive disorder usually causes feelings of sadness, hopelessness, or helplessness. Some people with this disorder do not feel particularly sad but lose interest in doing things they used to enjoy (anhedonia). Major depressive disorder also can cause physical symptoms. It can interfere with work, school, relationships, and other normal everyday activities. The disorder varies in severity but is longer lasting and more serious than the sadness we all feel from time to time in our lives. Major depressive disorder often is triggered by  stressful life events or major life changes. Examples of these triggers include divorce, loss of your job or home, a move, and the death of a family member or close friend. Sometimes this disorder occurs for no obvious reason at all. People who have family members with major depressive disorder or bipolar disorder are at higher risk for developing this disorder, with or without life stressors. Major depressive disorder can occur at any age. It may occur just once in your life (single episode major depressive disorder). It may occur multiple times (recurrent major depressive disorder). SYMPTOMS People with major depressive disorder have either anhedonia or depressed mood on nearly a daily basis for at least 2 weeks or longer. Symptoms of depressed mood include:  Feelings of sadness (blue or down in the dumps) or emptiness.  Feelings of hopelessness or helplessness.  Tearfulness or episodes of crying (may be observed by others).  Irritability (children and adolescents). In addition to depressed mood or anhedonia or both, people with this disorder have at least four of the following symptoms:  Difficulty sleeping or sleeping too much.   Significant change (increase or decrease) in appetite or weight.   Lack of energy or motivation.  Feelings of guilt and worthlessness.   Difficulty concentrating, remembering, or making decisions.  Unusually slow movement (psychomotor retardation) or restlessness (as observed by others).   Recurrent wishes for death, recurrent thoughts of self-harm (suicide), or a suicide attempt. People with major depressive disorder commonly have persistent negative thoughts about themselves, other people, and the world. People with severe major depressive disorder may experiencedistorted beliefs or  perceptions about the world (psychotic delusions). They also may see or hear things that are not real (psychotic hallucinations). DIAGNOSIS Major depressive disorder is  diagnosed through an assessment by your health care provider. Your health care provider will ask aboutaspects of your daily life, such as mood,sleep, and appetite, to see if you have the diagnostic symptoms of major depressive disorder. Your health care provider may ask about your medical history and use of alcohol or drugs, including prescription medicines. Your health care provider also may do a physical exam and blood work. This is because certain medical conditions and the use of certain substances can cause major depressive disorder-like symptoms (secondary depression). Your health care provider also may refer you to a mental health specialist for further evaluation and treatment. TREATMENT It is important to recognize the symptoms of major depressive disorder and seek treatment. The following treatments can be prescribed for this disorder:   Medicine. Antidepressant medicines usually are prescribed. Antidepressant medicines are thought to correct chemical imbalances in the brain that are commonly associated with major depressive disorder. Other types of medicine may be added if the symptoms do not respond to antidepressant medicines alone or if psychotic delusions or hallucinations occur.  Talk therapy. Talk therapy can be helpful in treating major depressive disorder by providing support, education, and guidance. Certain types of talk therapy also can help with negative thinking (cognitive behavioral therapy) and with relationship issues that trigger this disorder (interpersonal therapy). A mental health specialist can help determine which treatment is best for you. Most people with major depressive disorder do well with a combination of medicine and talk therapy. Treatments involving electrical stimulation of the brain can be used in situations with extremely severe symptoms or when medicine and talk therapy do not work over time. These treatments include electroconvulsive therapy, transcranial  magnetic stimulation, and vagal nerve stimulation.   This information is not intended to replace advice given to you by your health care provider. Make sure you discuss any questions you have with your health care provider.   Document Released: 09/05/2012 Document Revised: 06/01/2014 Document Reviewed: 09/05/2012 Elsevier Interactive Patient Education [employer].

## 2018-05-06 NOTE — Assessment & Plan Note (Addendum)
No acute respiratory distress.  SaO2 95%.  Improved with nebulizer treatment.  Pending chest x-ray.  Treat with albuterol at home, Augmentin, and prednisone.  Patient will let me know if not better.

## 2018-06-02 ENCOUNTER — Ambulatory Visit (INDEPENDENT_AMBULATORY_CARE_PROVIDER_SITE_OTHER): Payer: BC Managed Care – PPO | Admitting: Psychology

## 2018-06-02 DIAGNOSIS — F411 Generalized anxiety disorder: Secondary | ICD-10-CM | POA: Diagnosis not present

## 2018-06-17 ENCOUNTER — Ambulatory Visit: Payer: BC Managed Care – PPO | Admitting: Family Medicine

## 2018-06-17 ENCOUNTER — Encounter: Payer: Self-pay | Admitting: Family Medicine

## 2018-06-17 VITALS — BP 140/78 | HR 80 | Temp 98.2°F | Resp 20 | Ht 65.0 in | Wt 303.0 lb

## 2018-06-17 DIAGNOSIS — J309 Allergic rhinitis, unspecified: Secondary | ICD-10-CM | POA: Diagnosis not present

## 2018-06-17 DIAGNOSIS — J40 Bronchitis, not specified as acute or chronic: Secondary | ICD-10-CM

## 2018-06-17 DIAGNOSIS — J4531 Mild persistent asthma with (acute) exacerbation: Secondary | ICD-10-CM | POA: Diagnosis not present

## 2018-06-17 MED ORDER — MONTELUKAST SODIUM 10 MG PO TABS: 10.0000 mg | ORAL_TABLET | Freq: Every day | ORAL | 3 refills | Status: DC

## 2018-06-17 MED ORDER — BUDESONIDE-FORMOTEROL FUMARATE 160-4.5 MCG/ACT IN AERO: 2.0000 | INHALATION_SPRAY | Freq: Two times a day (BID) | RESPIRATORY_TRACT | 3 refills | Status: DC

## 2018-06-17 MED ORDER — PREDNISONE 10 MG PO TABS: ORAL_TABLET | ORAL | 0 refills | Status: DC

## 2018-06-17 NOTE — Progress Notes (Signed)
Subjective:    Patient ID: Marie Solis, female    DOB: 10/27/1965, 53 y.o.   MRN: [id number]  HPI   Patient presents to clinic complaining of continued cough, wheezing and chest congestion.  Patient has been dealing with this off and on since December 2019.  Patient does have a history of asthma, uses the lower dose of Symbicort inhaler 2 times per day for asthma stabilization and albuterol as needed.  Patient is concerned that exposure to black mold in her home irritated her asthma symptoms.  Currently she is not on any sort of allergy medication.  Patient did have chest x-ray done in December 2019, was negative for pneumonia.  Denies any fever or chills.  Denies nausea vomiting or diarrhea.  Denies chest pain.  Patient Active Problem List   Diagnosis Date Noted  . Bronchitis 05/06/2018  . Suppurative otitis media of both ears 05/06/2018  . Depression, major, single episode, mild ([place]) 05/06/2018  . Arthralgia 03/25/2018  . Nasal congestion 03/25/2018  . Vaginal dryness 03/25/2018  . Menorrhagia with regular cycle 11/03/2017  . Allergic rhinitis 11/03/2017  . Pelvic pain 08/02/2017  . Obesity 08/02/2017  . Acute non-recurrent maxillary sinusitis 04/27/2017  . Special screening for malignant neoplasms, colon   . Routine physical examination 12/07/2016  . Osteoarthritis 12/07/2016  . Carpal tunnel syndrome of left wrist 12/07/2016   Social History   Tobacco Use  . Smoking status: Never Smoker  . Smokeless tobacco: Never Used  Substance Use Topics  . Alcohol use: No   Review of Systems  Constitutional: Negative for chills, fatigue and fever.  HENT: Some nasal congestion. Negative for ear pain, sinus pain and sore throat.   Eyes: Negative.   Respiratory: + cough, chest congestion, shortness of breath and wheezing.   Cardiovascular: Negative for chest pain, palpitations and leg swelling.  Gastrointestinal: Negative for abdominal pain, diarrhea, nausea and vomiting.   Genitourinary: Negative for dysuria, frequency and urgency.  Musculoskeletal: Negative for arthralgias and myalgias.  Skin: Negative for color change, pallor and rash.  Neurological: Negative for syncope, light-headedness and headaches.  Psychiatric/Behavioral: The patient is not nervous/anxious.       Objective:   Physical Exam  Constitutional: She appears well-developed and well-nourished. No distress.  HENT:  Head: Normocephalic and atraumatic.  Eyes: Pupils are equal, round, and reactive to light. EOM are normal. No scleral icterus.  Neck: Normal range of motion. Neck supple. No tracheal deviation present.  Cardiovascular: Normal rate, regular rhythm and normal heart sounds.  Pulmonary/Chest: Effort normal, no respiratory distress.  Some expiratory wheezes.  Cough is harsh sounding.  Neurological: She is alert and oriented to person, place, and time.  Gait normal  Skin: Skin is warm and dry. No pallor.  Psychiatric: She has a normal mood and affect. Her behavior is normal. Thought content normal.  Nursing note and vitals reviewed.   Vitals:   06/17/18 1515  BP: 140/78  Pulse: 80  Resp: 20  Temp: 98.2 F (36.8 C)  SpO2: 97%      Assessment & Plan:   Mild persistent asthma with acute exacerbation, bronchitis, allergic rhinitis- suspect patient's symptoms are combination of bronchitis and asthma exacerbation triggered by allergies.  Patient will do steroid taper and we will increase Symbicort to the 160 dose.  We will also do referral to allergist for further investigation into possible allergy triggers that could be making her asthma symptoms worse.  She will begin Singulair 10 mg once per  day at bedtime to help better control allergy symptoms.  Advised patient to follow-up in about 2 weeks for recheck to be sure she is improving.  Patient is aware someone will contact her in regards to her allergy referral in the next 1 to 2 weeks.

## 2018-07-22 ENCOUNTER — Encounter: Payer: Self-pay | Admitting: Allergy

## 2018-07-22 ENCOUNTER — Ambulatory Visit: Payer: BC Managed Care – PPO | Admitting: Allergy

## 2018-07-22 VITALS — BP 154/82 | HR 71 | Temp 98.2°F | Resp 18 | Ht 65.0 in | Wt 300.2 lb

## 2018-07-22 DIAGNOSIS — J4540 Moderate persistent asthma, uncomplicated: Secondary | ICD-10-CM | POA: Diagnosis not present

## 2018-07-22 DIAGNOSIS — J3089 Other allergic rhinitis: Secondary | ICD-10-CM

## 2018-07-22 MED ORDER — AZELASTINE-FLUTICASONE 137-50 MCG/ACT NA SUSP: 1.0000 | Freq: Two times a day (BID) | NASAL | 5 refills | Status: DC

## 2018-07-22 NOTE — Patient Instructions (Addendum)
Asthma - continue Symbicort 129mcg 2 puffs twice a day - continue Singulair 10mg  daily - take in evenings - have access to albuterol inhaler 2 puffs every 4-6 hours as needed for cough/wheeze/shortness of breath/chest tightness.  May use 15-20 minutes prior to activity.   Monitor frequency of use.    Control goals:   Full participation in all desired activities (may need albuterol before activity)  Albuterol use two time or less a week on average (not counting use with activity)  Cough interfering with sleep two time or less a month  Oral steroids no more than once a year  No hospitalizations   Allergic rhinitis - environmental allergy skin testing today is positive to dust mites, tree pollen, ragweed, molds, cat, and dog.  - allergen avoidance measures discussed/handouts provided - recommend taking antihistamine (in conjunction with singulair which is not an antihistamine) like Allergra 180mg , Xyzal 5mg  or Zyrtec 10mg  daily as needed - recommend trial of Dymista.  Dymista 1 spray each nostril twice a day.  This is a combination nasal spray with Flonase + Astelin (nasal antihistamine).  This helps with both nasal congestion and drainage.   Hold your Flonase while using Dymista.   If insurance doesn't cover this will prescribe Astelin separately.  - allergen immunotherapy discussed today including protocol, benefits and risk.  Informational handout provided.  If interested in this therapuetic option you can check with your insurance carrier for coverage.  Let us know if you would like to proceed with this option.    Follow-up 3 months or sooner if needed

## 2018-07-22 NOTE — Progress Notes (Signed)
New Patient Note  RE: Marie Solis MRN: [id number] DOB: [birth year] Date of Office Visit: 07/22/2018  Referring provider: Jodelle Green, FNP Primary care provider: Burnard Hawthorne, FNP  Chief Complaint: Asthma and allergies  History of present illness: Marie Solis is a 53 y.o. female presenting today for consultation for asthma and allergic rhinitis.  Referral requested by FNP Guse.    She states she is having chronic congestion (nasal congestion, nasal drainage, cough) but feels like it is getting worse.  Symptoms use to be seasonal but now is year-round.  Symptoms have been worse since she has held her allgery medication (Singulair).  She will use flonase on occasion about 1-2 times a month and does help with nasal congestion.  She thought her allergy symptoms were being triggered by her work as she is a Pharmacist, hospital.   A year ago she did remove mold from her house and she does state the house is old.    She also reports having cough and SOB.  She is on symbicort 130mcg now for several years.  She takes 2 puffs twice a day.  She states she was not always very compliant with using her Symbicort consistently but she has been now over the past year.  She reports albuterol use about once a month mothly at night.  She states when she does need to use it it does relieve symptoms.  She was started on Singulair in the last month.  She did have a prednisone course in January that was very effective.  She states she usually gets bronchitis about twice a year that will get treated with antibiotics and typically steroids this was the case this past January.    No history of food allergy or eczema.  She does report if she eats a lot of dairy it increased mucus production.   Review of systems: Review of Systems  Constitutional: Negative for chills, fever and malaise/fatigue.  HENT: Positive for congestion. Negative for ear discharge, ear pain, nosebleeds, sinus pain and sore throat.     Eyes: Negative for pain, discharge and redness.  Respiratory: Positive for cough and shortness of breath. Negative for hemoptysis, sputum production and wheezing.   Cardiovascular: Negative for chest pain.  Gastrointestinal: Negative for abdominal pain, constipation, diarrhea, heartburn, nausea and vomiting.  Musculoskeletal: Negative for joint pain.  Skin: Negative for itching and rash.  Neurological: Negative for headaches.    All other systems negative unless noted above in HPI  Past medical history: Past Medical History:  Diagnosis Date  . Arthritis   . Asthma   . Back pain   . Colon polyps   . Cough   . Hypertension   . Joint pain   . Numbness in both hands   . Pre-diabetes   . Rheumatoid aortitis   . Shortness of breath   . Trouble in sleeping   . Vitamin B12 deficiency     Past surgical history: Past Surgical History:  Procedure Laterality Date  . COLONOSCOPY    . COLONOSCOPY WITH PROPOFOL N/A 01/07/2017   Procedure: COLONOSCOPY WITH PROPOFOL;  Surgeon: Lin Landsman, MD;  Location: [facility]COPY;  Service: Endoscopy;  Laterality: N/A;  . DILATION AND CURETTAGE OF UTERUS    . QUADRICEPS REPAIR    . TONSILLECTOMY    . tummy tuck      Family history:  Family History  Problem Relation Age of Onset  . Arthritis Mother   . Heart disease Mother   .  Hypertension Mother   . Depression Mother   . Eating disorder Mother   . Obesity Mother   . Arthritis Brother   . Heart disease Brother   . Hypertension Brother   . Cancer Brother        3 bothers melanoma  . Sudden death Father   . Breast cancer Neg Hx   . Allergic rhinitis Neg Hx   . Asthma Neg Hx     Social history: She lives in a home with carpeting in the bedroom with oil heating and central cooling.  There are no pets in the home but neighbors have cats and chickens.  There is no concern for water damage or mildew or roaches in the home.  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  She  denies a smoking history.  Medication List: Allergies as of 07/22/2018   No Known Allergies     Medication List       Accurate as of July 22, 2018  5:15 PM. Always use your most recent med list.        albuterol 108 (90 Base) MCG/ACT inhaler Commonly known as:  PROVENTIL HFA;VENTOLIN HFA Inhale 2 puffs into the lungs every 4 (four) hours as needed for wheezing or shortness of breath (cough, shortness of breath or wheezing.).   Azelastine-Fluticasone 137-50 MCG/ACT Susp Commonly known as:  DYMISTA Place 1 spray into both nostrils 2 (two) times daily.   budesonide-formoterol 160-4.5 MCG/ACT inhaler Commonly known as:  SYMBICORT Inhale 2 puffs into the lungs 2 (two) times daily.   calcium citrate-vitamin D 315-200 MG-UNIT tablet Commonly known as:  CITRACAL+D Take 1 tablet by mouth 2 (two) times daily.   fluticasone 50 MCG/ACT nasal spray Commonly known as:  FLONASE   montelukast 10 MG tablet Commonly known as:  SINGULAIR Take 1 tablet (10 mg total) by mouth at bedtime.   MULTIVITAMIN ADULT PO Take by mouth.   multivitamin tablet Take 1 tablet by mouth daily.   naproxen 250 MG tablet Commonly known as:  NAPROSYN Take by mouth 2 (two) times daily with a meal.   sertraline 50 MG tablet Commonly known as:  ZOLOFT Take 1 tablet (50 mg total) by mouth at bedtime.       Known medication allergies: No Known Allergies   Physical examination: Blood pressure (!) 154/82, pulse 71, temperature 98.2 F (36.8 C), temperature source Oral, resp. rate 18, height 5\' 5"  (1.651 m), weight (!) 300 lb 3.2 oz (136.2 kg), SpO2 96 %.  General: Alert, interactive, in no acute distress. HEENT: PERRLA, TMs pearly gray, turbinates mildly edematous with clear discharge, post-pharynx non erythematous. Neck: Supple without lymphadenopathy. Lungs: Clear to auscultation without wheezing, rhonchi or rales. {no increased work of breathing. CV: Normal S1, S2 without murmurs. Abdomen:  Nondistended, nontender. Skin: Warm and dry, without lesions or rashes. Extremities:  No clubbing, cyanosis or edema. Neuro:   Grossly intact.  Diagnositics/Labs: Labs/Imaging: personally reviewed without consolidation or infiltrate.  The lungs to me do not seem terribly hyperinflated which can also be seen in asthma. CXR 05/06/18: FINDINGS: Cardiac shadow is within normal limits. The lungs are hyperinflated consistent with COPD. No focal infiltrate is noted. No sizable effusion is seen. Degenerative changes of the thoracic spine are seen. No acute bony abnormality is noted.  IMPRESSION: No acute abnormality seen.  Spirometry: FEV1: 2.43L 88%, FVC: 2.87L 82%, ratio consistent with nonobstructive pattern  Allergy testing: environmental allergy skin prick testing is positive to pecan tree  pollen, dust mites, cockroach. 10 common food allergens are negative. Intradermal testing is positive to ragweed mix, weed mix, mold mix 1,2,3,4, cat and dog.  Allergy testing results were read and interpreted by provider, documented by clinical staff.   Assessment and plan:    Asthma, moderate persistent - continue Symbicort 150mcg 2 puffs twice a day - continue Singulair 10mg  daily - take in evenings - have access to albuterol inhaler 2 puffs every 4-6 hours as needed for cough/wheeze/shortness of breath/chest tightness.  May use 15-20 minutes prior to activity.   Monitor frequency of use.    Control goals:   Full participation in all desired activities (may need albuterol before activity)  Albuterol use two time or less a week on average (not counting use with activity)  Cough interfering with sleep two time or less a month  Oral steroids no more than once a year  No hospitalizations   Allergic rhinitis - environmental allergy skin testing today is positive to dust mites, tree pollen, ragweed, molds, cat, and dog.  - allergen avoidance measures discussed/handouts provided - recommend  taking antihistamine (in conjunction with singulair which is not an antihistamine) like Allergra 180mg , Xyzal 5mg  or Zyrtec 10mg  daily as needed - recommend trial of Dymista.  Dymista 1 spray each nostril twice a day.  This is a combination nasal spray with Flonase + Astelin (nasal antihistamine).  This helps with both nasal congestion and drainage.   Hold your Flonase while using Dymista.   If insurance doesn't cover this will prescribe Astelin separately.  - allergen immunotherapy discussed today including protocol, benefits and risk.  Informational handout provided.  If interested in this therapuetic option you can check with your insurance carrier for coverage.  Let us know if you would like to proceed with this option.    Follow-up 3 months or sooner if needed    I appreciate the opportunity to take part in Kirsten's care. Please do not hesitate to contact me with questions.  Sincerely,   Prudy Feeler, MD Allergy/Immunology [facility]

## 2018-07-25 ENCOUNTER — Telehealth: Payer: Self-pay

## 2018-07-25 NOTE — Telephone Encounter (Signed)
ERROR

## 2018-08-03 ENCOUNTER — Telehealth: Payer: Self-pay | Admitting: Family

## 2018-08-03 NOTE — Telephone Encounter (Signed)
Copied from [place] [id number]. Topic: Quick Communication - See Telephone Encounter >> Aug 03, 2018  9:56 AM Margot Ables wrote: CRM for notification. See Telephone encounter for: 08/03/18. Seth Bake wanted to notify provider that she called pt yesterday. Support and education for asthma was provided to the pt. They discussed using a peak flow meter. Seth Bake will f/u with the pt and check status and following plan of care provided by [name], NP

## 2018-08-03 NOTE — Telephone Encounter (Signed)
Noted  Noted, Pt is also est with Allergy for asthma

## 2018-08-05 ENCOUNTER — Telehealth: Payer: Self-pay

## 2018-08-05 ENCOUNTER — Encounter: Payer: Self-pay | Admitting: Family

## 2018-08-05 ENCOUNTER — Ambulatory Visit: Payer: BC Managed Care – PPO | Admitting: Family

## 2018-08-05 ENCOUNTER — Other Ambulatory Visit: Payer: Self-pay

## 2018-08-05 VITALS — BP 128/82 | HR 74 | Temp 98.6°F | Wt 297.0 lb

## 2018-08-05 DIAGNOSIS — Z23 Encounter for immunization: Secondary | ICD-10-CM

## 2018-08-05 DIAGNOSIS — F320 Major depressive disorder, single episode, mild: Secondary | ICD-10-CM

## 2018-08-05 DIAGNOSIS — J45909 Unspecified asthma, uncomplicated: Secondary | ICD-10-CM | POA: Diagnosis not present

## 2018-08-05 DIAGNOSIS — R0683 Snoring: Secondary | ICD-10-CM | POA: Diagnosis not present

## 2018-08-05 DIAGNOSIS — E669 Obesity, unspecified: Secondary | ICD-10-CM | POA: Diagnosis not present

## 2018-08-05 MED ORDER — SERTRALINE HCL 50 MG PO TABS: 100.0000 mg | ORAL_TABLET | Freq: Every day | ORAL | 3 refills | Status: DC

## 2018-08-05 NOTE — Patient Instructions (Addendum)
Please continue to follow with counseling.   This to a trial of 100 mg of Zoloft at bedtime.  Please let me know how you are doing.  Today we discussed referrals, orders.  Pulmonology, medical weight management   I have placed these orders in the system for you.  Please be sure to give [place] a call if you have not heard from our office regarding this. We should hear from [place] within ONE week with information regarding your appointment. If not, please let me know immediately.

## 2018-08-05 NOTE — Telephone Encounter (Signed)
I called patient & LM to see if she wanted to reschedule do to her having asthma with all the unknowns of [place] outbreak. [facility] may reschedule patient & ask if she has any acute issues or needs refills.

## 2018-08-05 NOTE — Progress Notes (Signed)
Subjective:    Patient ID: Marie Solis, female    DOB: [birth year], 53 y.o.   MRN: [id number]  CC: Marie Solis is a 53 y.o. female who presents today for follow up.   HPI: Asthma- 'breathing is better.'  Has bought new pillows, focused on cleaning house. Continues to cough, however improved. Sinus HA has resolved. No fever. Has stayed home this week from work. has seen allergy. New nasal spray with improvement. Starting to feel better.   Depression and anxiety- has increased. not sure if seasonal. Has been a tough year at school and particularly when having asthma flare ups. On zoloft. Feels less motivated. No si/hi  Snores  Has also seen Medical Weight Management.      Allergy 07/22/18  HISTORY:  Past Medical History:  Diagnosis Date  . Arthritis   . Asthma   . Back pain   . Colon polyps   . Cough   . Hypertension   . Joint pain   . Numbness in both hands   . Pre-diabetes   . Rheumatoid aortitis   . Shortness of breath   . Trouble in sleeping   . Vitamin B12 deficiency    Past Surgical History:  Procedure Laterality Date  . COLONOSCOPY    . COLONOSCOPY WITH PROPOFOL N/A 01/07/2017   Procedure: COLONOSCOPY WITH PROPOFOL;  Surgeon: Lin Landsman, MD;  Location: [facility] ENDOSCOPY;  Service: Endoscopy;  Laterality: N/A;  . DILATION AND CURETTAGE OF UTERUS    . QUADRICEPS REPAIR    . TONSILLECTOMY    . tummy tuck     Family History  Problem Relation Age of Onset  . Arthritis Mother   . Heart disease Mother   . Hypertension Mother   . Depression Mother   . Eating disorder Mother   . Obesity Mother   . Arthritis Brother   . Heart disease Brother   . Hypertension Brother   . Cancer Brother        3 bothers melanoma  . Sudden death Father   . Breast cancer Neg Hx   . Allergic rhinitis Neg Hx   . Asthma Neg Hx     Allergies: Patient has no known allergies. Current Outpatient Medications on File Prior to Visit  Medication Sig Dispense  Refill  . albuterol (PROVENTIL HFA;VENTOLIN HFA) 108 (90 Base) MCG/ACT inhaler Inhale 2 puffs into the lungs every 4 (four) hours as needed for wheezing or shortness of breath (cough, shortness of breath or wheezing.). 1 Inhaler 1  . Azelastine-Fluticasone (DYMISTA) 137-50 MCG/ACT SUSP Place 1 spray into both nostrils 2 (two) times daily. 1 Bottle 5  . budesonide-formoterol (SYMBICORT) 160-4.5 MCG/ACT inhaler Inhale 2 puffs into the lungs 2 (two) times daily. 1 Inhaler 3  . calcium citrate-vitamin D (CITRACAL+D) 315-200 MG-UNIT tablet Take 1 tablet by mouth 2 (two) times daily.    . montelukast (SINGULAIR) 10 MG tablet Take 1 tablet (10 mg total) by mouth at bedtime. 30 tablet 3  . Multiple Vitamin (MULTIVITAMIN) tablet Take 1 tablet by mouth daily.    . Multiple Vitamins-Minerals (MULTIVITAMIN ADULT PO) Take by mouth.    . naproxen (NAPROSYN) 250 MG tablet Take by mouth 2 (two) times daily with a meal.     No current facility-administered medications on file prior to visit.     Social History   Tobacco Use  . Smoking status: Never Smoker  . Smokeless tobacco: Never Used  Substance Use Topics  . Alcohol use:  No  . Drug use: No    Review of Systems  Constitutional: Negative for chills and fever.  Respiratory: Negative for cough (improved), shortness of breath and wheezing.   Cardiovascular: Negative for chest pain and palpitations.  Gastrointestinal: Negative for nausea and vomiting.  Psychiatric/Behavioral: Negative for suicidal ideas. The patient is nervous/anxious.       Objective:    BP 128/82 (BP Location: Left Arm, Patient Position: Sitting, Cuff Size: Large)   Pulse 74   Temp 98.6 F (37 C)   Wt 297 lb (134.7 kg)   SpO2 97%   BMI 49.42 kg/m  BP Readings from Last 3 Encounters:  08/05/18 128/82  07/22/18 (!) 154/82  06/17/18 140/78   Wt Readings from Last 3 Encounters:  08/05/18 297 lb (134.7 kg)  07/22/18 (!) 300 lb 3.2 oz (136.2 kg)  06/17/18 (!) 303 lb (137.4  kg)    Physical Exam Vitals signs reviewed.  Constitutional:      Appearance: She is well-developed.  Eyes:     Conjunctiva/sclera: Conjunctivae normal.  Cardiovascular:     Rate and Rhythm: Normal rate and regular rhythm.     Pulses: Normal pulses.     Heart sounds: Normal heart sounds.  Pulmonary:     Effort: Pulmonary effort is normal.     Breath sounds: Normal breath sounds. No wheezing, rhonchi or rales.  Skin:    General: Skin is warm and dry.  Neurological:     Mental Status: She is alert.  Psychiatric:        Speech: Speech normal.        Behavior: Behavior normal.        Thought Content: Thought content normal.        Assessment & Plan:   Problem List Items Addressed This Visit      Respiratory   Uncomplicated asthma    Improved.  Following with allergy at this time.  Will follow.      Relevant Orders   Ambulatory referral to Pulmonology     Other   Obesity    Counseled on weight loss, following a low carbohydrate, glycemic diet.  Referral to medical weight loss management.      Relevant Orders   Amb Ref to Medical Weight Management   Depression, major, single episode, mild ([place])    We jointly agreed with increase Zoloft first.  We did discussed Wellbutrin today.  May adjunct with  At a future later date.  Will follow      Relevant Medications   sertraline (ZOLOFT) 50 MG tablet   Snores - Primary    Pending evaluation for sleep apnea.       Other Visit Diagnoses    Need for immunization against influenza       Relevant Orders   Flu Vaccine QUAD 36+ mos IM (Completed)       I have discontinued Rickiya Picariello. Matteucci's fluticasone. I have also changed her sertraline. Additionally, I am having her maintain her multivitamin, calcium citrate-vitamin D, naproxen, albuterol, budesonide-formoterol, montelukast, Multiple Vitamins-Minerals (MULTIVITAMIN ADULT PO), and Azelastine-Fluticasone.   Meds ordered this encounter  Medications  . sertraline  (ZOLOFT) 50 MG tablet    Sig: Take 2 tablets (100 mg total) by mouth at bedtime.    Dispense:  180 tablet    Refill:  3    Order Specific Question:   Supervising Provider    Answer:   Crecencio Mc [[id number]]    Return precautions given.  Risks, benefits, and alternatives of the medications and treatment plan prescribed today were discussed, and patient expressed understanding.   Education regarding symptom management and diagnosis given to patient on AVS.  Continue to follow with Burnard Hawthorne, FNP for routine health maintenance.   Karl Luke and I agreed with plan.   [name], FNP

## 2018-08-05 NOTE — Telephone Encounter (Signed)
Pt states no, she prefers to come in. Pt will be at her 3:30 pm appt.

## 2018-08-08 DIAGNOSIS — J45909 Unspecified asthma, uncomplicated: Secondary | ICD-10-CM | POA: Insufficient documentation

## 2018-08-08 DIAGNOSIS — R0683 Snoring: Secondary | ICD-10-CM | POA: Insufficient documentation

## 2018-08-08 NOTE — Assessment & Plan Note (Signed)
Pending evaluation for sleep apnea.

## 2018-08-08 NOTE — Assessment & Plan Note (Signed)
Improved.  Following with allergy at this time.  Will follow.

## 2018-08-08 NOTE — Assessment & Plan Note (Signed)
We jointly agreed with increase Zoloft first.  We did discussed Wellbutrin today.  May adjunct with  At a future later date.  Will follow

## 2018-08-08 NOTE — Assessment & Plan Note (Signed)
Counseled on weight loss, following a low carbohydrate, glycemic diet.  Referral to medical weight loss management.

## 2018-08-17 ENCOUNTER — Encounter (INDEPENDENT_AMBULATORY_CARE_PROVIDER_SITE_OTHER): Payer: BC Managed Care – PPO

## 2018-08-22 ENCOUNTER — Ambulatory Visit (INDEPENDENT_AMBULATORY_CARE_PROVIDER_SITE_OTHER): Payer: BC Managed Care – PPO | Admitting: Family Medicine

## 2018-09-05 ENCOUNTER — Ambulatory Visit (INDEPENDENT_AMBULATORY_CARE_PROVIDER_SITE_OTHER): Payer: BC Managed Care – PPO | Admitting: Family Medicine

## 2018-09-28 ENCOUNTER — Other Ambulatory Visit: Payer: Self-pay

## 2018-09-28 MED ORDER — SERTRALINE HCL 100 MG PO TABS: 100.0000 mg | ORAL_TABLET | Freq: Every day | ORAL | 3 refills | Status: DC

## 2018-10-05 ENCOUNTER — Ambulatory Visit: Payer: Self-pay | Admitting: Family

## 2018-10-14 ENCOUNTER — Ambulatory Visit: Payer: BC Managed Care – PPO | Admitting: Allergy

## 2018-11-01 ENCOUNTER — Other Ambulatory Visit: Payer: Self-pay

## 2018-11-01 ENCOUNTER — Telehealth: Payer: Self-pay | Admitting: Family

## 2018-11-01 DIAGNOSIS — J40 Bronchitis, not specified as acute or chronic: Secondary | ICD-10-CM

## 2018-11-01 DIAGNOSIS — J309 Allergic rhinitis, unspecified: Secondary | ICD-10-CM

## 2018-11-01 MED ORDER — MONTELUKAST SODIUM 10 MG PO TABS: 10.0000 mg | ORAL_TABLET | Freq: Every day | ORAL | 3 refills | Status: DC

## 2018-11-01 NOTE — Telephone Encounter (Signed)
Copied from [place] [id number]. Topic: Quick Communication - Rx Refill/Question >> Nov 01, 2018 11:00 AM Percell Belt A wrote: Medication: montelukast (SINGULAIR) 10 MG tablet [[id number]]  Has the patient contacted their pharmacy? no (Agent: If no, request that the patient contact the pharmacy for the refill.) (Agent: If yes, when and what did the pharmacy advise?)  Preferred Pharmacy (with phone number or street name): [facility] [address]   Agent: Please be advised that RX refills may take up to 3 business days. We ask that you follow-up with your pharmacy.

## 2018-11-01 NOTE — Telephone Encounter (Signed)
Prescription sent patient notified. 

## 2018-11-07 ENCOUNTER — Encounter: Payer: BC Managed Care – PPO | Admitting: Family

## 2018-11-16 ENCOUNTER — Ambulatory Visit (INDEPENDENT_AMBULATORY_CARE_PROVIDER_SITE_OTHER): Payer: BC Managed Care – PPO | Admitting: Internal Medicine

## 2018-11-16 DIAGNOSIS — G4719 Other hypersomnia: Secondary | ICD-10-CM

## 2018-11-16 NOTE — Patient Instructions (Addendum)
Will send for sleep study.  Will send for sleep study. Continue Symbicort inhaler for cough possible chronic bronchitis/asthma.  Sleep Apnea    Sleep apnea is disorder that affects a person's sleep. A person with sleep apnea has abnormal pauses in their breathing when they sleep. It is hard for them to get a good sleep. This makes a person tired during the day. It also can lead to other physical problems. There are three types of sleep apnea. One type is when breathing stops for a short time because your airway is blocked (obstructive sleep apnea). Another type is when the brain sometimes fails to give the normal signal to breathe to the muscles that control your breathing (central sleep apnea). The third type is a combination of the other two types.  HOME CARE   Take all medicine as told by your doctor.  Avoid alcohol, calming medicines (sedatives), and depressant drugs.  Try to lose weight if you are overweight. Talk to your doctor about a healthy weight goal.  Your doctor may have you use a device that helps to open your airway. It can help you get the air that you need. It is called a positive airway pressure (PAP) device.   MAKE SURE YOU:   Understand these instructions.  Will watch your condition.  Will get help right away if you are not doing well or get worse.  It may take approximately 1 month for you to get used to wearing her CPAP every night.  Be sure to work with your machine to get used to it, be patient, it may take time!  If you have trouble tolerating CPAP DO NOT RETURN YOUR MACHINE; Contact our office to see if we can help you tolerate the CPAP better first!

## 2018-11-16 NOTE — Progress Notes (Signed)
[name]    Virtual Visit via Video Note I connected with patient on 11/16/18 at  2:45 PM EDT by video and verified that I am speaking with the correct person using two identifiers.   I discussed the limitations, risks of performing an evaluation and management service by video and the availability of in person appointments. I also discussed with the patient that there may be a patient responsible charge related to this service.  In light of current covid-19 pandemic, patient also understands that we are trying to protect them by minimizing in office contact if at all possible.  The patient expressed understanding and agreed to proceed. Please see note below for further detail.    The patient was advised to call back or seek an in-person evaluation if the symptoms worsen or if the condition fails to improve as anticipated.   Marie Hobby, MD   Assessment and Plan:  Excessive daytime sleepiness. - Symptoms and signs of obstructive sleep apnea. - We will send for sleep study, start on CPAP as indicated.  Dyspnea/possible asthma. Cough with possible chronic bronchitis. - Patient notes improvement in symptoms with Symbicort inhaler, recommended to continue. - She is planning on following up with her allergist and possibly starting allergy shots.  Orders Placed This Encounter  Procedures  . Home sleep test   Return in about 3 months (around 02/16/2019).   Date: 11/16/2018  MRN# [id number] Marie Solis [birth year]  Referring Physician: Dr. Vidal Schwalbe.   Marie Solis is a 53 y.o. old female seen in consultation for chief complaint of: daytime sleepiness.     HPI:  Marie Solis is a 53 y.o. old female She also has a chronic cough, which is mostly at night, she was started on symbicort which helped somewhat. She has never been diagnosed with respiratory disease as far as she knows.  She has been tested for allergies with  multiple positive environmental allergies, and is still following with an allergist, she has no pets.  She has albuterol but has not had to use it. She also uses a nasal spray.   She goes to bed at MN and uses tablet, she falls asleep around around 1 am, wakes at 8 am. When wakes feeling tired. She will take a nap around 11 for about 2-3 hours.  She snores at night, she has AM headaches.  No sleep walking, denies sleep paralysis, cataplexy, denies jaw pain, no TMJ.  Her brother has OSA.   PMHX:   Past Medical History:  Diagnosis Date  . Arthritis   . Asthma   . Back pain   . Colon polyps   . Cough   . Hypertension   . Joint pain   . Numbness in both hands   . Pre-diabetes   . Rheumatoid aortitis   . Shortness of breath   . Trouble in sleeping   . Vitamin B12 deficiency    Surgical Hx:  Past Surgical History:  Procedure Laterality Date  . COLONOSCOPY    . COLONOSCOPY WITH PROPOFOL N/A 01/07/2017   Procedure: COLONOSCOPY WITH PROPOFOL;  Surgeon: Lin Landsman, MD;  Location: [facility]COPY;  Service: Endoscopy;  Laterality: N/A;  . DILATION AND CURETTAGE OF UTERUS    . QUADRICEPS REPAIR    . TONSILLECTOMY    . tummy tuck     Family Hx:  Family History  Problem Relation Age of Onset  . Arthritis Mother   . Heart disease Mother   .  Hypertension Mother   . Depression Mother   . Eating disorder Mother   . Obesity Mother   . Arthritis Brother   . Heart disease Brother   . Hypertension Brother   . Cancer Brother        3 bothers melanoma  . Sudden death Father   . Breast cancer Neg Hx   . Allergic rhinitis Neg Hx   . Asthma Neg Hx    Social Hx:   Social History   Tobacco Use  . Smoking status: Never Smoker  . Smokeless tobacco: Never Used  Substance Use Topics  . Alcohol use: No  . Drug use: No   Medication:    Current Outpatient Medications:  .  albuterol (PROVENTIL HFA;VENTOLIN HFA) 108 (90 Base) MCG/ACT inhaler, Inhale 2 puffs into the lungs every 4  (four) hours as needed for wheezing or shortness of breath (cough, shortness of breath or wheezing.)., Disp: 1 Inhaler, Rfl: 1 .  Azelastine-Fluticasone (DYMISTA) 137-50 MCG/ACT SUSP, Place 1 spray into both nostrils 2 (two) times daily., Disp: 1 Bottle, Rfl: 5 .  budesonide-formoterol (SYMBICORT) 160-4.5 MCG/ACT inhaler, Inhale 2 puffs into the lungs 2 (two) times daily., Disp: 1 Inhaler, Rfl: 3 .  calcium citrate-vitamin D (CITRACAL+D) 315-200 MG-UNIT tablet, Take 1 tablet by mouth 2 (two) times daily., Disp: , Rfl:  .  montelukast (SINGULAIR) 10 MG tablet, Take 1 tablet (10 mg total) by mouth at bedtime., Disp: 30 tablet, Rfl: 3 .  Multiple Vitamin (MULTIVITAMIN) tablet, Take 1 tablet by mouth daily., Disp: , Rfl:  .  Multiple Vitamins-Minerals (MULTIVITAMIN ADULT PO), Take by mouth., Disp: , Rfl:  .  naproxen (NAPROSYN) 250 MG tablet, Take by mouth 2 (two) times daily with a meal., Disp: , Rfl:  .  sertraline (ZOLOFT) 100 MG tablet, Take 1 tablet (100 mg total) by mouth daily., Disp: 30 tablet, Rfl: 3   Allergies:  Patient has no known allergies.  Review of Systems: Gen:  Denies  fever, sweats, chills HEENT: Denies blurred vision, double vision. bleeds, sore throat Cvc:  No dizziness, chest pain. Resp:   Denies cough or sputum production, shortness of breath Gi: Denies swallowing difficulty, stomach pain. Gu:  Denies bladder incontinence, burning urine Ext:   No Joint pain, stiffness. Skin: No skin rash,  hives  Endoc:  No polyuria, polydipsia. Psych: No depression, insomnia. Other:  All other systems were reviewed with the patient and were negative other that what is mentioned in the HPI.   Physical Examination:  --    LABORATORY PANEL:   CBC No results for input(s): WBC, HGB, HCT, PLT in the last 168 hours. ------------------------------------------------------------------------------------------------------------------  Chemistries  No results for input(s): NA, K, CL,  CO2, GLUCOSE, BUN, CREATININE, CALCIUM, MG, AST, ALT, ALKPHOS, BILITOT in the last 168 hours.  Invalid input(s): GFRCGP ------------------------------------------------------------------------------------------------------------------  Cardiac Enzymes No results for input(s): TROPONINI in the last 168 hours. ------------------------------------------------------------  RADIOLOGY:  No results found.     Thank  you for the consultation and for allowing [facility] to assist in the care of your patient. Our recommendations are noted above.  Please contact us if we can be of further service.   Marda Stalker, M.D., F.C.C.P.  Board Certified in Internal Medicine, Pulmonary Medicine, [place], and Sleep Medicine.  [facility] Office Number: [phone]   11/16/2018

## 2018-12-08 ENCOUNTER — Telehealth: Payer: Self-pay | Admitting: Family

## 2018-12-08 NOTE — Telephone Encounter (Signed)
Pt dropped off form to be filled out for ABSS about Covid  Placed in Arnett's color folder upfront Please call pt when ready

## 2018-12-12 NOTE — Telephone Encounter (Signed)
I spoke with patient & notified that I would be faxing. I will put a copy upfront for her to pick up.

## 2018-12-12 NOTE — Telephone Encounter (Signed)
I have given to [facility] & will notify patient when form is ready.

## 2018-12-13 NOTE — Telephone Encounter (Signed)
I have faxed & put patient copy upfront for pick up.

## 2018-12-26 ENCOUNTER — Ambulatory Visit: Payer: BC Managed Care – PPO

## 2018-12-26 ENCOUNTER — Other Ambulatory Visit: Payer: Self-pay

## 2018-12-26 DIAGNOSIS — G4719 Other hypersomnia: Secondary | ICD-10-CM

## 2018-12-26 DIAGNOSIS — G4733 Obstructive sleep apnea (adult) (pediatric): Secondary | ICD-10-CM | POA: Diagnosis not present

## 2018-12-27 ENCOUNTER — Encounter: Payer: Self-pay | Admitting: Podiatry

## 2018-12-27 ENCOUNTER — Other Ambulatory Visit: Payer: Self-pay

## 2018-12-27 ENCOUNTER — Ambulatory Visit: Payer: BC Managed Care – PPO | Admitting: Podiatry

## 2018-12-27 ENCOUNTER — Ambulatory Visit (INDEPENDENT_AMBULATORY_CARE_PROVIDER_SITE_OTHER): Payer: BC Managed Care – PPO

## 2018-12-27 ENCOUNTER — Encounter: Payer: Self-pay | Admitting: Family Medicine

## 2018-12-27 ENCOUNTER — Ambulatory Visit: Payer: BC Managed Care – PPO | Admitting: Family Medicine

## 2018-12-27 VITALS — Temp 96.9°F

## 2018-12-27 VITALS — BP 140/80 | HR 71 | Temp 98.5°F | Resp 18 | Ht 65.0 in | Wt 307.6 lb

## 2018-12-27 DIAGNOSIS — W19XXXA Unspecified fall, initial encounter: Secondary | ICD-10-CM

## 2018-12-27 DIAGNOSIS — M25561 Pain in right knee: Secondary | ICD-10-CM

## 2018-12-27 DIAGNOSIS — M76829 Posterior tibial tendinitis, unspecified leg: Secondary | ICD-10-CM

## 2018-12-27 DIAGNOSIS — R2233 Localized swelling, mass and lump, upper limb, bilateral: Secondary | ICD-10-CM | POA: Diagnosis not present

## 2018-12-27 DIAGNOSIS — M79642 Pain in left hand: Secondary | ICD-10-CM

## 2018-12-27 LAB — CBC WITH DIFFERENTIAL/PLATELET
Basophils Absolute: 0 10*3/uL (ref 0.0–0.1)
Basophils Relative: 0.8 % (ref 0.0–3.0)
Eosinophils Absolute: 0.2 10*3/uL (ref 0.0–0.7)
Eosinophils Relative: 4.6 % (ref 0.0–5.0)
HCT: 38.6 % (ref 36.0–46.0)
Hemoglobin: 12.3 g/dL (ref 12.0–15.0)
Lymphocytes Relative: 27 % (ref 12.0–46.0)
Lymphs Abs: 1.3 10*3/uL (ref 0.7–4.0)
MCHC: 31.8 g/dL (ref 30.0–36.0)
MCV: 83.5 fl (ref 78.0–100.0)
Monocytes Absolute: 0.3 10*3/uL (ref 0.1–1.0)
Monocytes Relative: 6.3 % (ref 3.0–12.0)
Neutro Abs: 2.8 10*3/uL (ref 1.4–7.7)
Neutrophils Relative %: 61.3 % (ref 43.0–77.0)
Platelets: 268 10*3/uL (ref 150.0–400.0)
RBC: 4.62 Mil/uL (ref 3.87–5.11)
RDW: 15.1 % (ref 11.5–15.5)
WBC: 4.6 10*3/uL (ref 4.0–10.5)

## 2018-12-27 LAB — C-REACTIVE PROTEIN: CRP: <1 mg/dL (ref 0.5–20.0)

## 2018-12-27 LAB — SEDIMENTATION RATE: Sed Rate: 30 mm/hr (ref 0–30)

## 2018-12-27 NOTE — Progress Notes (Signed)
Subjective:    Patient ID: Marie Solis, female    DOB: [birth year], 53 y.o.   MRN: [id number]  HPI   Patient presents to clinic due to a fall that caused her to land on right knee, left hand.  Having some swelling in left hand and pain with some difficulty bending at middle and ring finger.  Denies any issues or pain in wrist, elbow or shoulder.  Has some bruising and tenderness of right knee, but walking is normal and does not notice any popping, clicking or locking in left knee.  Patient also concerned that she potentially could have some form of arthritis due to having some nodules in fingers of both hands.  She wants to different forms of arthritis ruled out to see if there is anything possible that could be done to slow progression of any arthritis.   Patient Active Problem List   Diagnosis Date Noted  . Snores 08/08/2018  . Uncomplicated asthma 59/56/3875  . Bronchitis 05/06/2018  . Suppurative otitis media of both ears 05/06/2018  . Depression, major, single episode, mild ([place]) 05/06/2018  . Arthralgia 03/25/2018  . Nasal congestion 03/25/2018  . Vaginal dryness 03/25/2018  . Menorrhagia with regular cycle 11/03/2017  . Allergic rhinitis 11/03/2017  . Pelvic pain 08/02/2017  . Obesity 08/02/2017  . Acute non-recurrent maxillary sinusitis 04/27/2017  . Special screening for malignant neoplasms, colon   . Routine physical examination 12/07/2016  . Osteoarthritis 12/07/2016  . Carpal tunnel syndrome of left wrist 12/07/2016   Social History   Tobacco Use  . Smoking status: Never Smoker  . Smokeless tobacco: Never Used  Substance Use Topics  . Alcohol use: No   Review of Systems  Constitutional: Negative for chills, fatigue and fever.  HENT: Negative for congestion, ear pain, sinus pain and sore throat.   Eyes: Negative.   Respiratory: Negative for cough, shortness of breath and wheezing.   Cardiovascular: Negative for chest pain, palpitations and leg  swelling.  Gastrointestinal: Negative for abdominal pain, diarrhea, nausea and vomiting.  Genitourinary: Negative for dysuria, frequency and urgency.  Musculoskeletal: pain in left hand and right knee from fall. +nodules in fingers,  ?RA vs OA Skin: Negative for color change, pallor and rash.  Neurological: Negative for syncope, light-headedness and headaches.  Psychiatric/Behavioral: The patient is not nervous/anxious.       Objective:   Physical Exam Vitals signs and nursing note reviewed.  Constitutional:      General: She is not in acute distress.    Appearance: She is obese. She is not ill-appearing, toxic-appearing or diaphoretic.  HENT:     Head: Normocephalic and atraumatic.  Cardiovascular:     Rate and Rhythm: Normal rate and regular rhythm.  Pulmonary:     Effort: Pulmonary effort is normal. No respiratory distress.     Breath sounds: Normal breath sounds.  Musculoskeletal:     Right knee: She exhibits ecchymosis (bruising indicated by red mark on diagram).     Left hand: She exhibits decreased range of motion (Decreased ROM in middle and ring finger. Able to wiggle fingers, but does slowly.) and tenderness (pain back of hand and also middle and ring finger. ).       Legs:     Comments: ROM of right knee appears intact. Negative anterior and posterior drawer test. Negative McMurray test. No pain at extreme limits of range.   Skin:    General: Skin is warm and dry.     Findings:  Bruising (bruising on right knee) present.  Neurological:     General: No focal deficit present.     Mental Status: She is alert and oriented to person, place, and time.     Gait: Gait (no limping with walking. ) normal.  Psychiatric:        Mood and Affect: Mood normal.        Behavior: Behavior normal.        Thought Content: Thought content normal.        Judgment: Judgment normal.    Today's Vitals   12/27/18 0929  BP: 140/80  Pulse: 71  Resp: 18  Temp: 98.5 F (36.9 C)   TempSrc: Oral  SpO2: 91%  Weight: (!) 307 lb 9.6 oz (139.5 kg)  Height: 5\' 5"  (1.651 m)   Body mass index is 51.19 kg/m.     Assessment & Plan:    Fall, left hand pain, right knee pain - we will get x-rays of both left hand and right knee in clinic.  Patient advised to use Tylenol or ibuprofen as needed for pain.  She will also try using a Ace bandage around the left hand to help reduce swelling and to provide some compression in hopes of improving pain.  Declines Rx for PRN tramadol. Depending on x-ray results, it will determine whether or not orthopedic referral is required.  Nodules on fingers - patient will get blood work in clinic today to look for markers that can be present and rheumatoid arthritis. Advised it is possible to have some nodules with osteoarthritis, not just RA.   Patient aware she will be contacted with results when they are available.  She will otherwise keep regularly scheduled follow-up with PCP as planned.

## 2018-12-28 ENCOUNTER — Ambulatory Visit (INDEPENDENT_AMBULATORY_CARE_PROVIDER_SITE_OTHER): Payer: BC Managed Care – PPO | Admitting: Orthotics

## 2018-12-28 ENCOUNTER — Other Ambulatory Visit: Payer: BC Managed Care – PPO | Admitting: Orthotics

## 2018-12-28 DIAGNOSIS — M76829 Posterior tibial tendinitis, unspecified leg: Secondary | ICD-10-CM

## 2018-12-28 DIAGNOSIS — M722 Plantar fascial fibromatosis: Secondary | ICD-10-CM

## 2018-12-29 ENCOUNTER — Other Ambulatory Visit: Payer: Self-pay | Admitting: Family Medicine

## 2018-12-29 DIAGNOSIS — R768 Other specified abnormal immunological findings in serum: Secondary | ICD-10-CM

## 2018-12-29 LAB — ANTI-NUCLEAR AB-TITER (ANA TITER): ANA Titer 1: 1:40 {titer} — ABNORMAL HIGH

## 2018-12-29 LAB — RHEUMATOID FACTOR: Rheumatoid fact SerPl-aCnc: <14 IU/mL (ref <14)

## 2018-12-29 LAB — ANA: Anti Nuclear Antibody (ANA): POSITIVE — AB

## 2018-12-29 NOTE — Progress Notes (Signed)
Rheumatology referral

## 2018-12-29 NOTE — Progress Notes (Signed)
    HPI: 53 year old female presenting today with a chief complaint of intermittent dull aching pain noted to the bilateral feet secondary to posterior tibial tendinitis that has been ongoing for the past few years. She states she normally wears orthotics which help alleviate the pain but believes she needs a new pair. Standing and walking for long periods of time increases her pain. Patient is here for further evaluation and treatment.    Past Medical History:  Diagnosis Date  . Arthritis   . Asthma   . Back pain   . Colon polyps   . Cough   . Hypertension   . Joint pain   . Numbness in both hands   . Pre-diabetes   . Rheumatoid aortitis   . Shortness of breath   . Trouble in sleeping   . Vitamin B12 deficiency        Physical Exam: General: The patient is alert and oriented x3 in no acute distress.  Dermatology: Skin is warm, dry and supple bilateral lower extremities. Negative for open lesions or macerations.  Vascular: Palpable pedal pulses bilaterally. No edema or erythema noted. Capillary refill within normal limits.  Neurological: Epicritic and protective threshold grossly intact bilaterally.   Musculoskeletal Exam: Pain on palpation noted to the posterior tibial tendon of the bilateral feet. Range of motion within normal limits. Muscle strength 5/5 in all muscle groups bilateral lower extremities.  Assessment: 1. Posterior tibial tendinitis bilateral   Plan of Care:  1. Patient was evaluated.  2. Appointment with Liliane Channel, Pedorthist, for custom molded orthotics.  3. Continue wearing good shoe gear. Do not go barefoot. 4. Return to clinic when necessary.   Works for [employer].   Edrick Kins, DPM [facility]  Dr. Edrick Kins, [address]                                        [place], [place] [address]                Office [phone]  Fax [phone]

## 2019-01-02 ENCOUNTER — Telehealth: Payer: Self-pay

## 2019-01-02 DIAGNOSIS — G4733 Obstructive sleep apnea (adult) (pediatric): Secondary | ICD-10-CM | POA: Diagnosis not present

## 2019-01-02 NOTE — Telephone Encounter (Signed)
Pt notified that her sleep study showed mild OSA which is predominantly seen in the supine position. Explained to pt that it is recommended  that she either start auto cpap with a pressure range of 5-20 cm H2O or positional therapy  Using a wedge to prevent turning to the supine position. Pt verbalized understanding and wished to try the wedge first. Pt will call back for cpap if the wedge does not work for her. No further action at this time.

## 2019-01-03 NOTE — Progress Notes (Signed)
Patient presents today with a hx of PTTD/AAF.  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  Patient has medially shifted talus/navicular.  Goal is provide longitudinal arch support and RF stability.  Plan on deep heel cup, hug arch, wide foot orthosis w/ medial flange and varus correction for RF valgus deformity.  Patient educated in the progessive nature of PTTD and financial responsibility.  

## 2019-01-25 ENCOUNTER — Ambulatory Visit: Payer: BC Managed Care – PPO | Admitting: Family

## 2019-01-25 ENCOUNTER — Other Ambulatory Visit: Payer: BC Managed Care – PPO | Admitting: Orthotics

## 2019-01-25 ENCOUNTER — Encounter: Payer: Self-pay | Admitting: Family

## 2019-01-25 ENCOUNTER — Other Ambulatory Visit: Payer: Self-pay

## 2019-01-25 VITALS — BP 122/82 | HR 68 | Ht 66.0 in | Wt 305.8 lb

## 2019-01-25 DIAGNOSIS — M79642 Pain in left hand: Secondary | ICD-10-CM

## 2019-01-25 DIAGNOSIS — G4733 Obstructive sleep apnea (adult) (pediatric): Secondary | ICD-10-CM

## 2019-01-25 DIAGNOSIS — J45909 Unspecified asthma, uncomplicated: Secondary | ICD-10-CM

## 2019-01-25 DIAGNOSIS — Z23 Encounter for immunization: Secondary | ICD-10-CM

## 2019-01-25 DIAGNOSIS — Z1239 Encounter for other screening for malignant neoplasm of breast: Secondary | ICD-10-CM

## 2019-01-25 DIAGNOSIS — F320 Major depressive disorder, single episode, mild: Secondary | ICD-10-CM

## 2019-01-25 MED ORDER — PREDNISONE 10 MG PO TABS: ORAL_TABLET | ORAL | 0 refills | Status: DC

## 2019-01-25 MED ORDER — BUPROPION HCL ER (XL) 150 MG PO TB24: 150.0000 mg | ORAL_TABLET | Freq: Every day | ORAL | 1 refills | Status: DC

## 2019-01-25 NOTE — Progress Notes (Signed)
Subjective:    Patient ID: Marie Solis, female    DOB: 1965/10/27, 53 y.o.   MRN: [id number]  CC: Marie Solis is a 53 y.o. female who presents today for follow up.   HPI: CC: Left hand pain for approximately 4 weeks, this is unchanged from a fall.  She also describes swelling on the dorsal aspect of her hand.  She describes the pain as over the left third, fourth, fifth MCPs and it is hard to bend.  She was seen by my colleague approximately 3 weeks ago with x-rays.  No evidence of fracture in the left hand or the right knee x-ray.  She does report that her knee pain has improved and she is "walking fine".  She denies any joint swelling, redness or increased heat.  She has no numbness in her hand.  She has no history of gout.  She is icing without improvement.  She received a call from rheumatologist however has not called  them back in regards to an appointment.   Depression-increased of late.  She feels like she is having trouble motivating and just feels down.  Zoloft has helped with her sleep and she likes the 100 mg dose.  She has no thoughts of hurting herself or anyone else.  She is no history of seizure, eating disorder.   She states that her breathing is going well.  No wheezing, cough.  She was diagnosed with mild sleep apnea, Dr Ashby Dawes.  She was advised no CPAP machine.  She does sleeping on her side with improvement.    HISTORY:  Past Medical History:  Diagnosis Date  . Arthritis   . Asthma   . Back pain   . Colon polyps   . Cough   . Hypertension   . Joint pain   . Numbness in both hands   . Pre-diabetes   . Rheumatoid aortitis   . Shortness of breath   . Trouble in sleeping   . Vitamin B12 deficiency    Past Surgical History:  Procedure Laterality Date  . COLONOSCOPY    . COLONOSCOPY WITH PROPOFOL N/A 01/07/2017   Procedure: COLONOSCOPY WITH PROPOFOL;  Surgeon: Lin Landsman, MD;  Location: [facility] ENDOSCOPY;  Service: Endoscopy;   Laterality: N/A;  . DILATION AND CURETTAGE OF UTERUS    . QUADRICEPS REPAIR    . TONSILLECTOMY    . tummy tuck     Family History  Problem Relation Age of Onset  . Arthritis Mother   . Heart disease Mother   . Hypertension Mother   . Depression Mother   . Eating disorder Mother   . Obesity Mother   . Arthritis Brother   . Heart disease Brother   . Hypertension Brother   . Cancer Brother        3 bothers melanoma  . Sudden death Father   . Breast cancer Neg Hx   . Allergic rhinitis Neg Hx   . Asthma Neg Hx     Allergies: Patient has no known allergies. Current Outpatient Medications on File Prior to Visit  Medication Sig Dispense Refill  . albuterol (PROVENTIL HFA;VENTOLIN HFA) 108 (90 Base) MCG/ACT inhaler Inhale 2 puffs into the lungs every 4 (four) hours as needed for wheezing or shortness of breath (cough, shortness of breath or wheezing.). 1 Inhaler 1  . Azelastine-Fluticasone (DYMISTA) 137-50 MCG/ACT SUSP Place 1 spray into both nostrils 2 (two) times daily. 1 Bottle 5  . budesonide-formoterol (SYMBICORT) 160-4.5 MCG/ACT  inhaler Inhale 2 puffs into the lungs 2 (two) times daily. 1 Inhaler 3  . calcium citrate-vitamin D (CITRACAL+D) 315-200 MG-UNIT tablet Take 1 tablet by mouth 2 (two) times daily.    . montelukast (SINGULAIR) 10 MG tablet Take 1 tablet (10 mg total) by mouth at bedtime. 30 tablet 3  . Multiple Vitamins-Minerals (MULTIVITAMIN ADULT PO) Take by mouth.    . naproxen (NAPROSYN) 250 MG tablet Take by mouth 2 (two) times daily with a meal.    . sertraline (ZOLOFT) 100 MG tablet Take 1 tablet (100 mg total) by mouth daily. 30 tablet 3   No current facility-administered medications on file prior to visit.     Social History   Tobacco Use  . Smoking status: Never Smoker  . Smokeless tobacco: Never Used  Substance Use Topics  . Alcohol use: No  . Drug use: No    Review of Systems    Objective:    BP 122/82   Pulse 68   Ht 5\' 6"  (1.676 m)   Wt (!)  305 lb 12.8 oz (138.7 kg)   SpO2 97%   BMI 49.36 kg/m  BP Readings from Last 3 Encounters:  01/25/19 122/82  12/27/18 140/80  08/05/18 128/82   Wt Readings from Last 3 Encounters:  01/25/19 (!) 305 lb 12.8 oz (138.7 kg)  12/27/18 (!) 307 lb 9.6 oz (139.5 kg)  08/05/18 297 lb (134.7 kg)    Physical Exam Vitals signs reviewed.  Constitutional:      Appearance: She is well-developed.  Eyes:     Conjunctiva/sclera: Conjunctivae normal.  Cardiovascular:     Rate and Rhythm: Normal rate and regular rhythm.     Pulses: Normal pulses.     Heart sounds: Normal heart sounds.  Pulmonary:     Effort: Pulmonary effort is normal.     Breath sounds: Normal breath sounds. No wheezing, rhonchi or rales.  Musculoskeletal:     Left wrist: She exhibits normal range of motion, no tenderness, no bony tenderness and no swelling.     Left hand: She exhibits decreased range of motion, tenderness and swelling. She exhibits normal capillary refill. Normal sensation noted. Normal strength noted.       Hands:     Comments: Left PIP and MCP tenderness over 3rd, 4th, and 5th. Trace swelling noted over dorsal aspect of left hand. Pain with flexion of 3rd, 4th, 5th metacarpal. No erythema, increased warm. Skin intact.  Palpable radial pulses. Sensation intact.    Skin:    General: Skin is warm and dry.  Neurological:     Mental Status: She is alert.  Psychiatric:        Speech: Speech normal.        Behavior: Behavior normal.        Thought Content: Thought content normal.        Assessment & Plan:   Problem List Items Addressed This Visit      Respiratory   Reactive airway disease    Appears controlled, will follow      OSA (obstructive sleep apnea)    Improved since sleeping on her side.  Will follow        Other   Depression, major, single episode, mild ([place]) - Primary    Worsened of late, will adjunct Zoloft with Wellbutrin with close follow-up.      Relevant Medications    buPROPion (WELLBUTRIN XL) 150 MG 24 hr tablet   Left hand pain  Presentation over MCP joints raises my clinical suspicion  for autoimmune etiology perhaps triggered after trauma.  Positive ANA and referral is in place rheumatology.  Emphasized importance of making this appointment; patient verbalized understanding.  She states she will call to do this.  In the interim, I have given her a very short course of prednisone to see if this helps with pain, swelling.  Advised to continue icing regimen.      Relevant Medications   predniSONE (DELTASONE) 10 MG tablet    Other Visit Diagnoses    Need for immunization against influenza       Relevant Orders   Flu Vaccine QUAD 36+ mos IM (Completed)   Screening for breast cancer       Relevant Orders   MM 3D SCREEN BREAST BILATERAL   Need for pneumococcal vaccination       Relevant Orders   Pneumococcal polysaccharide vaccine 23-valent greater than or equal to 2yo subcutaneous/IM (Completed)     Of note, mammogram is due which I have ordered.  Patient understands to schedule  I am having Lavila Moskal. Capron start on predniSONE and buPROPion. I am also having her maintain her calcium citrate-vitamin D, naproxen, albuterol, budesonide-formoterol, Multiple Vitamins-Minerals (MULTIVITAMIN ADULT PO), Azelastine-Fluticasone, sertraline, and montelukast.   Meds ordered this encounter  Medications  . predniSONE (DELTASONE) 10 MG tablet    Sig: Take 40 mg by mouth on day 1, then taper 10 mg daily until gone    Dispense:  10 tablet    Refill:  0    Order Specific Question:   Supervising Provider    Answer:   Deborra Medina L [[id number]]  . buPROPion (WELLBUTRIN XL) 150 MG 24 hr tablet    Sig: Take 1 tablet (150 mg total) by mouth daily. Take one tablet by mouth every morning for 7 days, and then increase to two tablets by mouth every morning.    Dispense:  90 tablet    Refill:  1    Order Specific Question:   Supervising Provider    Answer:   Crecencio Mc [[id number]]    Return precautions given.   Risks, benefits, and alternatives of the medications and treatment plan prescribed today were discussed, and patient expressed understanding.   Education regarding symptom management and diagnosis given to patient on AVS.  Continue to follow with Burnard Hawthorne, FNP for routine health maintenance.   Karl Luke and I agreed with plan.   [name], FNP

## 2019-01-25 NOTE — Patient Instructions (Signed)
Please call rheumatology and make an appointment as discussed as concerned that your hand pain may be related to autoimmune In the meantime, we may trial short course of prednisone  Trial of wellbutrin.   Please follow up with Lauren Guse in 6-8 weeks, sooner if needed.   Please call call and schedule your 3D mammogram as discussed.   Marie Solis  [facility], [place]

## 2019-01-26 DIAGNOSIS — G4733 Obstructive sleep apnea (adult) (pediatric): Secondary | ICD-10-CM | POA: Insufficient documentation

## 2019-01-26 NOTE — Assessment & Plan Note (Signed)
Improved since sleeping on her side.  Will follow

## 2019-01-26 NOTE — Assessment & Plan Note (Signed)
Presentation over MCP joints raises my clinical suspicion  for autoimmune etiology perhaps triggered after trauma.  Positive ANA and referral is in place rheumatology.  Emphasized importance of making this appointment; patient verbalized understanding.  She states she will call to do this.  In the interim, I have given her a very short course of prednisone to see if this helps with pain, swelling.  Advised to continue icing regimen.

## 2019-01-26 NOTE — Assessment & Plan Note (Signed)
Worsened of late, will adjunct Zoloft with Wellbutrin with close follow-up.

## 2019-01-26 NOTE — Assessment & Plan Note (Signed)
Appears controlled, will follow

## 2019-01-30 ENCOUNTER — Other Ambulatory Visit: Payer: Self-pay | Admitting: Family

## 2019-02-08 ENCOUNTER — Other Ambulatory Visit: Payer: Self-pay

## 2019-02-08 ENCOUNTER — Ambulatory Visit: Payer: BC Managed Care – PPO | Admitting: Orthotics

## 2019-02-08 DIAGNOSIS — M76829 Posterior tibial tendinitis, unspecified leg: Secondary | ICD-10-CM

## 2019-02-08 DIAGNOSIS — M79642 Pain in left hand: Secondary | ICD-10-CM

## 2019-02-08 NOTE — Progress Notes (Signed)
Patient came in today to p/up functional foot orthotics.   The orthotics were assessed to both fit and function.  The F/O addressed the biomechanical issues/pathologies as intended, offering good longitudinal arch support, proper offloading, and foot support. There weren't any signs of discomfort or irritation.  The F/O fit properly in footwear with minimal trimming/adjustments. 

## 2019-02-23 ENCOUNTER — Encounter: Payer: Self-pay | Admitting: Family

## 2019-03-07 ENCOUNTER — Other Ambulatory Visit: Payer: Self-pay | Admitting: Family

## 2019-03-07 ENCOUNTER — Other Ambulatory Visit: Payer: Self-pay | Admitting: Family Medicine

## 2019-03-07 DIAGNOSIS — J40 Bronchitis, not specified as acute or chronic: Secondary | ICD-10-CM

## 2019-03-07 DIAGNOSIS — J4531 Mild persistent asthma with (acute) exacerbation: Secondary | ICD-10-CM

## 2019-03-07 DIAGNOSIS — J309 Allergic rhinitis, unspecified: Secondary | ICD-10-CM

## 2019-04-08 ENCOUNTER — Other Ambulatory Visit: Payer: Self-pay | Admitting: Family

## 2019-04-25 ENCOUNTER — Other Ambulatory Visit: Payer: Self-pay

## 2019-04-25 ENCOUNTER — Ambulatory Visit
Admission: EM | Admit: 2019-04-25 | Discharge: 2019-04-25 | Disposition: A | Discharge status: Home / Self Care | Payer: BC Managed Care – PPO | Attending: Emergency Medicine | Admitting: Emergency Medicine

## 2019-04-25 ENCOUNTER — Ambulatory Visit: Payer: Self-pay

## 2019-04-25 ENCOUNTER — Encounter: Payer: Self-pay | Admitting: Emergency Medicine

## 2019-04-25 DIAGNOSIS — K921 Melena: Secondary | ICD-10-CM | POA: Diagnosis not present

## 2019-04-25 DIAGNOSIS — K5900 Constipation, unspecified: Secondary | ICD-10-CM | POA: Diagnosis not present

## 2019-04-25 LAB — POC HEMOCCULT BLD/STL (OFFICE/1-CARD/DIAGNOSTIC): Fecal Occult Blood, POC: POSITIVE — AB

## 2019-04-25 MED ORDER — HYDROCORTISONE ACETATE 25 MG RE SUPP: 25.0000 mg | Freq: Two times a day (BID) | RECTAL | 0 refills | Status: DC

## 2019-04-25 MED ORDER — DOCUSATE SODIUM 100 MG PO CAPS: 100.0000 mg | ORAL_CAPSULE | Freq: Every day | ORAL | 0 refills | Status: DC

## 2019-04-25 NOTE — Telephone Encounter (Signed)
I spoke with pt; pt has had rectal bleeding for 1 wk; now there is a lot of bright red blood and blood in water of commode; there is also bright red blood on tissue; no blood clots seen. Pt said she has had some constipation and does not feel like she is emptying her bowel when she has BM. Pt has mid abd pain when has BM. We do not have available appts at [facility] this afternoon and Pt will go to [facility] in [place] for eval. FYI to [place].

## 2019-04-25 NOTE — Discharge Instructions (Addendum)
Use the suppositories as directed.  Take the stool softener Colace daily.  Follow-up with your primary care provider tomorrow to discuss a possible referral to a GI specialist.    Go to the emergency department if you have increased bleeding or develop new symptoms such as abdominal pain or fever.  Your blood pressure is elevated today at 142/93.  Please have this rechecked by your primary care provider in 2-4 weeks.

## 2019-04-25 NOTE — ED Triage Notes (Signed)
Patient in office today stated that a week ago had droplets of blood in stool now has gotten heavier  [id number]

## 2019-04-25 NOTE — Telephone Encounter (Signed)
Received this skype from [1:05 PM] Tedd Sias (AP)     Hi Zanai Mallari I left a message for a patient who is a [facility] patient.  She is needing an appointment today or tomorrow for rectal bleeding. Can you take a look.  She will go to UC if she doesn't hear back.  she is working until 3.  Sorry I just could not get through and she wanted me to ask.  Taycee Snelling   MRN [id number]. PEC:

## 2019-04-25 NOTE — ED Provider Notes (Signed)
[name]    CSN: [id number] Arrival date & time: 04/25/19  1615      History   Chief Complaint Chief Complaint  Patient presents with  . Rectal Bleeding    HPI Marie Solis is a 53 y.o. female.   Presents with 1 week history of bright red blood in her stool.  She reports a moderate amount of blood and small blood clots.  She also reports some constipation and straining to have a bowel movement.  No known cause or trauma.  She denies fever, dizziness, weakness, abdominal pain, vomiting, diarrhea, or other concerns.  No treatment attempted at home.    The history is provided by the patient.    Past Medical History:  Diagnosis Date  . Arthritis   . Asthma   . Back pain   . Colon polyps   . Cough   . Hypertension   . Joint pain   . Numbness in both hands   . Pre-diabetes   . Rheumatoid aortitis   . Shortness of breath   . Trouble in sleeping   . Vitamin B12 deficiency     Patient Active Problem List   Diagnosis Date Noted  . OSA (obstructive sleep apnea) 01/26/2019  . Left hand pain 01/25/2019  . Snores 08/08/2018  . Uncomplicated asthma [id number]  . Bronchitis 05/06/2018  . Suppurative otitis media of both ears 05/06/2018  . Depression, major, single episode, mild ([place]) 05/06/2018  . Arthralgia 03/25/2018  . Nasal congestion 03/25/2018  . Vaginal dryness 03/25/2018  . Menorrhagia with regular cycle 11/03/2017  . Allergic rhinitis 11/03/2017  . Pelvic pain 08/02/2017  . Obesity 08/02/2017  . Acute non-recurrent maxillary sinusitis 04/27/2017  . Special screening for malignant neoplasms, colon   . Routine physical examination 12/07/2016  . Osteoarthritis 12/07/2016  . Carpal tunnel syndrome of left wrist 12/07/2016  . Dyspnea 02/01/2015  . History of hypertension 11/11/2012  . Chronic cough 08/25/2011  . Reactive airway disease 08/25/2011  . Vitamin D deficiency 08/25/2011    Past Surgical History:  Procedure Laterality Date  .  COLONOSCOPY    . COLONOSCOPY WITH PROPOFOL N/A 01/07/2017   Procedure: COLONOSCOPY WITH PROPOFOL;  Surgeon: Lin Landsman, MD;  Location[facility] END[facility]COPY;  Service: Endoscopy;  Laterality: N/A;  . DILATION AND CURETTAGE OF UTERUS    . QUADRICEPS REPAIR    . TONSILLECTOMY    . tummy tuck      OB History    Gravida  0   Para  0   Term  0   Preterm  0   AB  0   Living  0     SAB  0   TAB  0   Ectopic  0   Multiple  0   Live Births  0            Home Medications    Prior to Admission medications   Medication Sig Start Date End Date Taking? Authorizing Provider  albuterol (PROVENTIL HFA;VENTOLIN HFA) 108 (90 Base) MCG/ACT inhaler Inhale 2 puffs into the lungs every 4 (four) hours as needed for wheezing or shortness of breath (cough, shortness of breath or wheezing.). 05/06/18   Burnard Hawthorne, FNP  Azelastine-Fluticasone (DYMISTA) 137-50 MCG/ACT SUSP Place 1 spray into both nostrils 2 (two) times daily. 07/22/18   Kennith Gain, MD  buPROPion (WELLBUTRIN XL) 150 MG 24 hr tablet Take 1 tablet (150 mg total) by mouth daily. Take one tablet by  mouth every morning for 7 days, and then increase to two tablets by mouth every morning. 01/25/19   Burnard Hawthorne, FNP  calcium citrate-vitamin D (CITRACAL+D) 315-200 MG-UNIT tablet Take 1 tablet by mouth 2 (two) times daily.    [provider]  docusate sodium (COLACE) 100 MG capsule Take 1 capsule (100 mg total) by mouth daily. 04/25/19   Sharion Balloon, NP  hydrocortisone (ANUSOL-HC) 25 MG suppository Place 1 suppository (25 mg total) rectally 2 (two) times daily. 04/25/19   Sharion Balloon, NP  montelukast (SINGULAIR) 10 MG tablet TAKE 1 TABLET BY MOUTH AT BEDTIME 03/10/19   Burnard Hawthorne, FNP  Multiple Vitamins-Minerals (MULTIVITAMIN ADULT PO) Take by mouth.    [provider]  naproxen (NAPROSYN) 250 MG tablet Take by mouth 2 (two) times daily with a meal.    [provider]   predniSONE (DELTASONE) 10 MG tablet Take 40 mg by mouth on day 1, then taper 10 mg daily until gone 01/25/19   Burnard Hawthorne, FNP  sertraline (ZOLOFT) 100 MG tablet Take 1 tablet by mouth once daily 04/11/19   Burnard Hawthorne, FNP  SYMBICORT 160-4.5 MCG/ACT inhaler Inhale 2 puffs by mouth twice daily 03/08/19   Jodelle Green, FNP    Family History Family History  Problem Relation Age of Onset  . Arthritis Mother   . Heart disease Mother   . Hypertension Mother   . Depression Mother   . Eating disorder Mother   . Obesity Mother   . Arthritis Brother   . Heart disease Brother   . Hypertension Brother   . Cancer Brother        3 bothers melanoma  . Sudden death Father   . Breast cancer Neg Hx   . Allergic rhinitis Neg Hx   . Asthma Neg Hx     Social History Social History   Tobacco Use  . Smoking status: Never Smoker  . Smokeless tobacco: Never Used  Substance Use Topics  . Alcohol use: No  . Drug use: No     Allergies   Patient has no known allergies.   Review of Systems Review of Systems  Constitutional: Negative for chills and fever.  HENT: Negative for ear pain and sore throat.   Eyes: Negative for pain and visual disturbance.  Respiratory: Negative for cough and shortness of breath.   Cardiovascular: Negative for chest pain and palpitations.  Gastrointestinal: Positive for blood in stool and constipation. Negative for abdominal pain, diarrhea, nausea and vomiting.  Genitourinary: Negative for dysuria and hematuria.  Musculoskeletal: Negative for arthralgias and back pain.  Skin: Negative for color change and rash.  Neurological: Negative for seizures and syncope.  All other systems reviewed and are negative.    Physical Exam Triage Vital Signs ED Triage Vitals  Enc Vitals Group     BP      Pulse      Resp      Temp      Temp src      SpO2      Weight      Height      Head Circumference      Peak Flow      Pain Score      Pain Loc       Pain Edu?      Excl. in [place]?    No data found.  Updated Vital Signs BP (!) 142/93 (BP Location: Left Arm)  Pulse 70   Temp 98.6 F (37 C) (Oral)   Resp 18   Wt 290 lb (131.5 kg)   SpO2 95%   BMI 46.81 kg/m   Visual Acuity Right Eye Distance:   Left Eye Distance:   Bilateral Distance:    Right Eye Near:   Left Eye Near:    Bilateral Near:     Physical Exam Vitals signs and nursing note reviewed.  Constitutional:      General: She is not in acute distress.    Appearance: She is well-developed. She is obese. She is not ill-appearing.  HENT:     Head: Normocephalic and atraumatic.     Mouth/Throat:     Mouth: Mucous membranes are moist.     Pharynx: Oropharynx is clear.  Eyes:     Conjunctiva/sclera: Conjunctivae normal.  Neck:     Musculoskeletal: Neck supple.  Cardiovascular:     Rate and Rhythm: Normal rate and regular rhythm.     Heart sounds: No murmur.  Pulmonary:     Effort: Pulmonary effort is normal. No respiratory distress.     Breath sounds: Normal breath sounds.  Abdominal:     General: Bowel sounds are normal. There is no distension.     Palpations: Abdomen is soft.     Tenderness: There is no abdominal tenderness. There is no guarding or rebound.  Genitourinary:    Rectum: Guaiac result positive.  Skin:    General: Skin is warm and dry.     Findings: No rash.  Neurological:     General: No focal deficit present.     Mental Status: She is alert and oriented to person, place, and time.  Psychiatric:        Mood and Affect: Mood normal.        Behavior: Behavior normal.      UC Treatments / Results  Labs (all labs ordered are listed, but only abnormal results are displayed) Labs Reviewed  POC HEMOCCULT BLD/STL (OFFICE/1-CARD/DIAGNOSTIC) - Abnormal; Notable for the following components:      Result Value   Fecal Occult Blood, POC Positive (*)    All other components within normal limits    EKG   Radiology No results found.   Procedures Procedures (including critical care time)  Medications Ordered in UC Medications - No data to display  Initial Impression / Assessment and Plan / UC Course  I have reviewed the triage vital signs and the nursing notes.  Pertinent labs & imaging results that were available during my care of the patient were reviewed by me and considered in my medical decision making (see chart for details).   Hematochezia.  Treating with Anusol HC suppositories and Colace.  Instructed patient to follow-up with her PCP tomorrow morning to discuss the possibility of a GI referral.  Instructed her to go to the emergency department if she has acute increase in her bleeding or new symptoms such as abdominal pain or fever.  Also discussed with patient that her blood pressure is slightly elevated today needs to be rechecked by her PCP in 2 to 4 weeks.  Patient agrees to this plan of care.     Final Clinical Impressions(s) / UC Diagnoses   Final diagnoses:  Hematochezia     Discharge Instructions     Use the suppositories as directed.  Take the stool softener Colace daily.  Follow-up with your primary care provider tomorrow to discuss a possible referral to a GI specialist.  Go to the emergency department if you have increased bleeding or develop new symptoms such as abdominal pain or fever.  Your blood pressure is elevated today at 142/93.  Please have this rechecked by your primary care provider in 2-4 weeks.      ED Prescriptions    Medication Sig Dispense Auth. Provider   hydrocortisone (ANUSOL-HC) 25 MG suppository Place 1 suppository (25 mg total) rectally 2 (two) times daily. 12 suppository Sharion Balloon, NP   docusate sodium (COLACE) 100 MG capsule Take 1 capsule (100 mg total) by mouth daily. 30 capsule Sharion Balloon, NP     PDMP not reviewed this encounter.   Sharion Balloon, NP 04/25/19 [phone]

## 2019-04-25 NOTE — Telephone Encounter (Signed)
Patient called stating that she has had rectal bleeding over 1 week She states that it is with each BM daily.  She states that yesterday and today it turned the water bright red.  She has been constipated because of some medication changes.  She has had bleeding about 1 year ago and a colonoscopy showed nothing. She has no abdominal pain. She is not weak. She takes no blood thinners. Care advice read to patient. She prefers an appointment over UC. [facility] has no availability. Call was placed to [facility] per patient preference. Left message on triage line after several attempts. Patient will go to UC after work if she does not hear from [facility] office. Care advice read to patient.  She verbalized understanding of all information.   Reason for Disposition . MODERATE rectal bleeding (small blood clots, passing blood without stool, or toilet water turns red)  Answer Assessment - Initial Assessment Questions 1. APPEARANCE of BLOOD: "What color is it?" "Is it passed separately, on the surface of the stool, or mixed in with the stool?"      Toilet bright red 2. AMOUNT: "How much blood was passed?"      alot 3. FREQUENCY: "How many times has blood been passed with the stools?"      Usually daily 4. ONSET: "When was the blood first seen in the stools?" (Days or weeks)     Over 1 week 5. DIARRHEA: "Is there also some diarrhea?" If so, ask: "How many diarrhea stools were passed in past 24 hours?"      no 6. CONSTIPATION: "Do you have constipation?" If so, "How bad is it?"    Some hard stool 7. RECURRENT SYMPTOMS: "Have you had blood in your stools before?" If so, ask: "When was the last time?" and "What happened that time?"     About 1 year ago had colonscopy 8. BLOOD THINNERS: "Do you take any blood thinners?" (e.g., Coumadin/warfarin, Pradaxa/dabigatran, aspirin)    no 9. OTHER SYMPTOMS: "Do you have any other symptoms?"  (e.g., abdominal pain, vomiting, dizziness, fever)   No 10.  PREGNANCY: "Is there any chance you are pregnant?" "When was your last menstrual period?"       N/A 3weeks ago  Protocols used: RECTAL BLEEDING-A-AH

## 2019-05-10 ENCOUNTER — Other Ambulatory Visit: Payer: Self-pay | Admitting: Family

## 2019-05-10 DIAGNOSIS — F320 Major depressive disorder, single episode, mild: Secondary | ICD-10-CM

## 2019-05-12 ENCOUNTER — Telehealth: Payer: Self-pay | Admitting: Podiatry

## 2019-05-12 NOTE — Telephone Encounter (Signed)
Pt left voicemail on 12.15 stating she was having a  pair of orthotics refurbished but never received a call to let her know they came back in.   This is a [place] pt

## 2019-05-17 ENCOUNTER — Telehealth: Payer: Self-pay | Admitting: Family

## 2019-05-17 NOTE — Telephone Encounter (Signed)
Patient dropped office FMLA paper work. Paper work is in [employer] up front.

## 2019-05-22 NOTE — Telephone Encounter (Signed)
Can you look at this and start if you can? I can complete on weds

## 2019-05-24 NOTE — Telephone Encounter (Signed)
LM that paperwork had been completed & that it had been faxed back with confirmation.

## 2019-05-24 NOTE — Telephone Encounter (Signed)
completed

## 2019-06-19 ENCOUNTER — Ambulatory Visit (INDEPENDENT_AMBULATORY_CARE_PROVIDER_SITE_OTHER): Payer: BC Managed Care – PPO | Admitting: Family

## 2019-06-19 ENCOUNTER — Other Ambulatory Visit: Payer: Self-pay

## 2019-06-19 ENCOUNTER — Encounter: Payer: Self-pay | Admitting: Family

## 2019-06-19 VITALS — Ht 66.0 in | Wt 270.0 lb

## 2019-06-19 DIAGNOSIS — K921 Melena: Secondary | ICD-10-CM | POA: Insufficient documentation

## 2019-06-19 DIAGNOSIS — J309 Allergic rhinitis, unspecified: Secondary | ICD-10-CM | POA: Diagnosis not present

## 2019-06-19 DIAGNOSIS — J45909 Unspecified asthma, uncomplicated: Secondary | ICD-10-CM

## 2019-06-19 DIAGNOSIS — J40 Bronchitis, not specified as acute or chronic: Secondary | ICD-10-CM | POA: Diagnosis not present

## 2019-06-19 DIAGNOSIS — F320 Major depressive disorder, single episode, mild: Secondary | ICD-10-CM | POA: Diagnosis not present

## 2019-06-19 DIAGNOSIS — E669 Obesity, unspecified: Secondary | ICD-10-CM

## 2019-06-19 MED ORDER — BUPROPION HCL ER (XL) 300 MG PO TB24: 300.0000 mg | ORAL_TABLET | Freq: Every morning | ORAL | 2 refills | Status: DC

## 2019-06-19 MED ORDER — MONTELUKAST SODIUM 10 MG PO TABS: 10.0000 mg | ORAL_TABLET | Freq: Every day | ORAL | 1 refills | Status: DC

## 2019-06-19 NOTE — Assessment & Plan Note (Signed)
Congratulated significant weight loss, 30lbs

## 2019-06-19 NOTE — Assessment & Plan Note (Signed)
Doing well on Wellbutrin, Zoloft and congratulated patient on weight loss.  Continue regimen.

## 2019-06-19 NOTE — Assessment & Plan Note (Signed)
Symptomatically stable, improved.  Continue current regimen

## 2019-06-19 NOTE — Assessment & Plan Note (Signed)
Pleased resolved. Although the patient had a positive Hemoccult at an urgent care visit.  Advised her to repeat the studies to ensure negative; if hemorrhoids I would expect a negative Hemoccult.  This has been ordered and patient will come pick up in the office

## 2019-06-19 NOTE — Progress Notes (Signed)
Virtual Visit via Video Note  I connected with@  on 06/19/19 at  3:00 PM EST by a video enabled telemedicine application and verified that I am speaking with the correct person using two identifiers.  Location patient: home Location provider:home office Persons participating in the virtual visit: patient, provider  I discussed the limitations of evaluation and management by telemedicine and the availability of in person appointments. The patient expressed understanding and agreed to proceed.   HPI: Feels well.  Has started walking, changing diet. Has lost 30lbs. Thinks motiveation and increased energy. Doing well on wellbutrin, zoloft,  Rectal bleeding which she was evaluated for at [facility] 04/25/19. Has done suppositories and increased fiber with resolve. No straining with BM. Regular BMs. No constipations, abdominal pain.   Asthma- doing well on regimen. Singular helps keep symptoms controlled. No sob, wheezing.   Due mammogram UTD colonoscopy ROS: See pertinent positives and negatives per HPI.  Past Medical History:  Diagnosis Date  . Arthritis   . Asthma   . Back pain   . Colon polyps   . Cough   . Hypertension   . Joint pain   . Numbness in both hands   . Pre-diabetes   . Rheumatoid aortitis   . Shortness of breath   . Trouble in sleeping   . Vitamin B12 deficiency     Past Surgical History:  Procedure Laterality Date  . COLONOSCOPY    . COLONOSCOPY WITH PROPOFOL N/A 01/07/2017   Procedure: COLONOSCOPY WITH PROPOFOL;  Surgeon: Lin Landsman, MD;  Location: [facility] ENDOSCOPY;  Service: Endoscopy;  Laterality: N/A;  . DILATION AND CURETTAGE OF UTERUS    . QUADRICEPS REPAIR    . TONSILLECTOMY    . tummy tuck      Family History  Problem Relation Age of Onset  . Arthritis Mother   . Heart disease Mother   . Hypertension Mother   . Depression Mother   . Eating disorder Mother   . Obesity Mother   . Arthritis Brother   . Heart disease Brother   . Hypertension  Brother   . Cancer Brother        3 bothers melanoma  . Sudden death Father   . Breast cancer Neg Hx   . Allergic rhinitis Neg Hx   . Asthma Neg Hx     SOCIAL HX: non smoker   Current Outpatient Medications:  .  albuterol (PROVENTIL HFA;VENTOLIN HFA) 108 (90 Base) MCG/ACT inhaler, Inhale 2 puffs into the lungs every 4 (four) hours as needed for wheezing or shortness of breath (cough, shortness of breath or wheezing.)., Disp: 1 Inhaler, Rfl: 1 .  Azelastine-Fluticasone (DYMISTA) 137-50 MCG/ACT SUSP, Place 1 spray into both nostrils 2 (two) times daily., Disp: 1 Bottle, Rfl: 5 .  calcium citrate-vitamin D (CITRACAL+D) 315-200 MG-UNIT tablet, Take 1 tablet by mouth 2 (two) times daily., Disp: , Rfl:  .  docusate sodium (COLACE) 100 MG capsule, Take 1 capsule (100 mg total) by mouth daily., Disp: 30 capsule, Rfl: 0 .  hydrocortisone (ANUSOL-HC) 25 MG suppository, Place 1 suppository (25 mg total) rectally 2 (two) times daily., Disp: 12 suppository, Rfl: 0 .  montelukast (SINGULAIR) 10 MG tablet, Take 1 tablet (10 mg total) by mouth at bedtime., Disp: 90 tablet, Rfl: 1 .  Multiple Vitamins-Minerals (MULTIVITAMIN ADULT PO), Take by mouth., Disp: , Rfl:  .  naproxen (NAPROSYN) 250 MG tablet, Take by mouth 2 (two) times daily with a meal., Disp: , Rfl:  .  predniSONE (DELTASONE) 10 MG tablet, Take 40 mg by mouth on day 1, then taper 10 mg daily until gone, Disp: 10 tablet, Rfl: 0 .  sertraline (ZOLOFT) 100 MG tablet, Take 1 tablet by mouth once daily, Disp: 30 tablet, Rfl: 0 .  SYMBICORT 160-4.5 MCG/ACT inhaler, Inhale 2 puffs by mouth twice daily, Disp: 11 g, Rfl: 0 .  buPROPion (WELLBUTRIN XL) 300 MG 24 hr tablet, Take 1 tablet (300 mg total) by mouth every morning., Disp: 90 tablet, Rfl: 2  EXAM:  VITALS per patient if applicable:  GENERAL: alert, oriented, appears well and in no acute distress  HEENT: atraumatic, conjunttiva clear, no obvious abnormalities on inspection of external nose  and ears  NECK: normal movements of the head and neck  LUNGS: on inspection no signs of respiratory distress, breathing rate appears normal, no obvious gross SOB, gasping or wheezing  CV: no obvious cyanosis  MS: moves all visible extremities without noticeable abnormality  PSYCH/NEURO: pleasant and cooperative, no obvious depression or anxiety, speech and thought processing grossly intact  ASSESSMENT AND PLAN:  Discussed the following assessment and plan:  Depression, major, single episode, mild (HCC) - Plan: buPROPion (WELLBUTRIN XL) 300 MG 24 hr tablet  Bronchitis - Plan: montelukast (SINGULAIR) 10 MG tablet  Allergic rhinitis, unspecified seasonality, unspecified trigger - Plan: montelukast (SINGULAIR) 10 MG tablet  Blood in stool - Plan: Fecal occult blood, imunochemical  Uncomplicated asthma, unspecified asthma severity, unspecified whether persistent  Obesity, unspecified classification, unspecified obesity type, unspecified whether serious comorbidity present Problem List Items Addressed This Visit      Respiratory   Allergic rhinitis   Relevant Medications   montelukast (SINGULAIR) 10 MG tablet   Bronchitis   Relevant Medications   montelukast (SINGULAIR) 10 MG tablet   Uncomplicated asthma    Symptomatically stable, improved.  Continue current regimen      Relevant Medications   montelukast (SINGULAIR) 10 MG tablet     Other   Blood in stool    Pleased resolved. Although the patient had a positive Hemoccult at an urgent care visit.  Advised her to repeat the studies to ensure negative; if hemorrhoids I would expect a negative Hemoccult.  This has been ordered and patient will come pick up in the office      Relevant Orders   Fecal occult blood, imunochemical   Depression, major, single episode, mild ([place]) - Primary    Doing well on Wellbutrin, Zoloft and congratulated patient on weight loss.  Continue regimen.      Relevant Medications   buPROPion  (WELLBUTRIN XL) 300 MG 24 hr tablet   Obesity    Congratulated significant weight loss, 30lbs       Of note, I reminded patient about her mammogram, she will schedule this  -we discussed possible serious and likely etiologies, options for evaluation and workup, limitations of telemedicine visit vs in person visit, treatment, treatment risks and precautions. Pt prefers to treat via telemedicine empirically rather then risking or undertaking an in person visit at this moment. Patient agrees to seek prompt in person care if worsening, new symptoms arise, or if is not improving with treatment.   I discussed the assessment and treatment plan with the patient. The patient was provided an opportunity to ask questions and all were answered. The patient agreed with the plan and demonstrated an understanding of the instructions.   The patient was advised to call back or seek an in-person evaluation if the symptoms worsen or  if the condition fails to improve as anticipated.   [name], FNP

## 2019-06-20 NOTE — Progress Notes (Signed)
I have put up front for patient to pick up. I have also informed that cards need to come back to [place] & do not send herself.

## 2019-06-21 ENCOUNTER — Other Ambulatory Visit: Payer: Self-pay | Admitting: Family

## 2019-06-21 DIAGNOSIS — J309 Allergic rhinitis, unspecified: Secondary | ICD-10-CM

## 2019-06-21 DIAGNOSIS — J40 Bronchitis, not specified as acute or chronic: Secondary | ICD-10-CM

## 2019-07-04 ENCOUNTER — Other Ambulatory Visit (INDEPENDENT_AMBULATORY_CARE_PROVIDER_SITE_OTHER): Payer: BC Managed Care – PPO

## 2019-07-04 DIAGNOSIS — K921 Melena: Secondary | ICD-10-CM

## 2019-07-04 LAB — FECAL OCCULT BLOOD, IMMUNOCHEMICAL: Fecal Occult Bld: NEGATIVE

## 2019-07-05 ENCOUNTER — Other Ambulatory Visit: Payer: Self-pay | Admitting: Family

## 2019-07-05 DIAGNOSIS — K921 Melena: Secondary | ICD-10-CM

## 2019-07-19 ENCOUNTER — Other Ambulatory Visit: Payer: BC Managed Care – PPO

## 2019-07-23 ENCOUNTER — Encounter: Payer: Self-pay | Admitting: Family

## 2019-07-25 ENCOUNTER — Other Ambulatory Visit: Payer: Self-pay | Admitting: Family

## 2019-08-04 ENCOUNTER — Other Ambulatory Visit: Payer: BC Managed Care – PPO

## 2019-08-08 ENCOUNTER — Other Ambulatory Visit (INDEPENDENT_AMBULATORY_CARE_PROVIDER_SITE_OTHER): Payer: BC Managed Care – PPO

## 2019-08-08 ENCOUNTER — Other Ambulatory Visit: Payer: Self-pay

## 2019-08-08 DIAGNOSIS — K921 Melena: Secondary | ICD-10-CM | POA: Diagnosis not present

## 2019-08-09 LAB — CBC WITH DIFFERENTIAL/PLATELET
Basophils Absolute: 0.1 10*3/uL (ref 0.0–0.1)
Basophils Relative: 0.9 % (ref 0.0–3.0)
Eosinophils Absolute: 0.5 10*3/uL (ref 0.0–0.7)
Eosinophils Relative: 6 % — ABNORMAL HIGH (ref 0.0–5.0)
HCT: 40 % (ref 36.0–46.0)
Hemoglobin: 13.1 g/dL (ref 12.0–15.0)
Lymphocytes Relative: 30.5 % (ref 12.0–46.0)
Lymphs Abs: 2.4 10*3/uL (ref 0.7–4.0)
MCHC: 32.6 g/dL (ref 30.0–36.0)
MCV: 85.5 fl (ref 78.0–100.0)
Monocytes Absolute: 0.5 10*3/uL (ref 0.1–1.0)
Monocytes Relative: 6.8 % (ref 3.0–12.0)
Neutro Abs: 4.4 10*3/uL (ref 1.4–7.7)
Neutrophils Relative %: 55.8 % (ref 43.0–77.0)
Platelets: 293 10*3/uL (ref 150.0–400.0)
RBC: 4.68 Mil/uL (ref 3.87–5.11)
RDW: 15.8 % — ABNORMAL HIGH (ref 11.5–15.5)
WBC: 8 10*3/uL (ref 4.0–10.5)

## 2019-08-14 ENCOUNTER — Telehealth: Payer: Self-pay | Admitting: Family

## 2019-08-14 ENCOUNTER — Other Ambulatory Visit: Payer: Self-pay

## 2019-08-14 DIAGNOSIS — J4531 Mild persistent asthma with (acute) exacerbation: Secondary | ICD-10-CM

## 2019-08-14 DIAGNOSIS — J40 Bronchitis, not specified as acute or chronic: Secondary | ICD-10-CM

## 2019-08-14 MED ORDER — SYMBICORT 160-4.5 MCG/ACT IN AERO: 2.0000 | INHALATION_SPRAY | Freq: Two times a day (BID) | RESPIRATORY_TRACT | 0 refills | Status: DC

## 2019-08-14 NOTE — Telephone Encounter (Signed)
Refill sent to [facility].

## 2019-08-14 NOTE — Telephone Encounter (Signed)
Needs a refill on SYMBICORT 160-4.5 MCG/ACT inhaler sent to [facility]

## 2019-08-31 ENCOUNTER — Other Ambulatory Visit: Payer: Self-pay | Admitting: Family

## 2019-09-28 ENCOUNTER — Other Ambulatory Visit: Payer: Self-pay | Admitting: Family

## 2019-09-28 DIAGNOSIS — J40 Bronchitis, not specified as acute or chronic: Secondary | ICD-10-CM

## 2019-09-28 DIAGNOSIS — J4531 Mild persistent asthma with (acute) exacerbation: Secondary | ICD-10-CM

## 2019-10-07 ENCOUNTER — Other Ambulatory Visit: Payer: Self-pay | Admitting: Family

## 2019-10-07 DIAGNOSIS — J309 Allergic rhinitis, unspecified: Secondary | ICD-10-CM

## 2019-10-07 DIAGNOSIS — J40 Bronchitis, not specified as acute or chronic: Secondary | ICD-10-CM

## 2019-10-27 ENCOUNTER — Ambulatory Visit: Payer: BC Managed Care – PPO | Admitting: Family

## 2019-11-06 ENCOUNTER — Telehealth (INDEPENDENT_AMBULATORY_CARE_PROVIDER_SITE_OTHER): Payer: BC Managed Care – PPO | Admitting: Family

## 2019-11-06 ENCOUNTER — Other Ambulatory Visit: Payer: Self-pay

## 2019-11-06 VITALS — Ht 65.0 in | Wt 268.0 lb

## 2019-11-06 DIAGNOSIS — M79642 Pain in left hand: Secondary | ICD-10-CM | POA: Diagnosis not present

## 2019-11-06 DIAGNOSIS — F320 Major depressive disorder, single episode, mild: Secondary | ICD-10-CM

## 2019-11-06 DIAGNOSIS — E669 Obesity, unspecified: Secondary | ICD-10-CM

## 2019-11-06 NOTE — Assessment & Plan Note (Signed)
Doing well Wellbutrin, will continue

## 2019-11-06 NOTE — Assessment & Plan Note (Signed)
Chronic since injury.  Agreed at this point cif onservative management has not yet resolved pain/swelling, we will consult orthopedics

## 2019-11-06 NOTE — Progress Notes (Signed)
Virtual Visit via Video Note  I connected with@  on 11/06/19 at 12:00 PM EDT by a video enabled telemedicine application and verified that I am speaking with the correct person using two identifiers.  Location patient: home Location provider:work  Persons participating in the virtual visit: patient, provider  I discussed the limitations of evaluation and management by telemedicine and the availability of in person appointments. The patient expressed understanding and agreed to proceed.   HPI:  Feels well today Remains frustrated by weight, and finding harder to lose weight.  In the past she has been seeing [facility]. No further bleeding from hemorrhoids. Left hand continues to have swelling and pain with flexion. Incident occurred while gardening 10 months ago and she fell on outstretched on hand. Tried ice and ibuprofen without resolve.  Would like referral to orthopedics XR left hand 12/2018.  Knee pain has resolved.   Feels well on Wellbutrin.  It has been helpful for her.  Would like to continue  Plans to call and schedule mammogam today.   ROS: See pertinent positives and negatives per HPI.  Past Medical History:  Diagnosis Date  . Arthritis   . Asthma   . Back pain   . Colon polyps   . Cough   . Hypertension   . Joint pain   . Numbness in both hands   . Pre-diabetes   . Rheumatoid aortitis   . Shortness of breath   . Trouble in sleeping   . Vitamin B12 deficiency     Past Surgical History:  Procedure Laterality Date  . COLONOSCOPY    . COLONOSCOPY WITH PROPOFOL N/A 01/07/2017   Procedure: COLONOSCOPY WITH PROPOFOL;  Surgeon: Lin Landsman, MD;  Location: [facility] ENDOSCOPY;  Service: Endoscopy;  Laterality: N/A;  . DILATION AND CURETTAGE OF UTERUS    . QUADRICEPS REPAIR    . TONSILLECTOMY    . tummy tuck      Family History  Problem Relation Age of Onset  . Arthritis Mother   . Heart disease Mother   . Hypertension Mother   . Depression  Mother   . Eating disorder Mother   . Obesity Mother   . Arthritis Brother   . Heart disease Brother   . Hypertension Brother   . Cancer Brother        3 bothers melanoma  . Sudden death Father   . Breast cancer Neg Hx   . Allergic rhinitis Neg Hx   . Asthma Neg Hx        Current Outpatient Medications:  .  albuterol (PROVENTIL HFA;VENTOLIN HFA) 108 (90 Base) MCG/ACT inhaler, Inhale 2 puffs into the lungs every 4 (four) hours as needed for wheezing or shortness of breath (cough, shortness of breath or wheezing.)., Disp: 1 Inhaler, Rfl: 1 .  Azelastine-Fluticasone (DYMISTA) 137-50 MCG/ACT SUSP, Place 1 spray into both nostrils 2 (two) times daily., Disp: 1 Bottle, Rfl: 5 .  buPROPion (WELLBUTRIN XL) 300 MG 24 hr tablet, Take 1 tablet (300 mg total) by mouth every morning., Disp: 90 tablet, Rfl: 2 .  calcium citrate-vitamin D (CITRACAL+D) 315-200 MG-UNIT tablet, Take 1 tablet by mouth 2 (two) times daily., Disp: , Rfl:  .  docusate sodium (COLACE) 100 MG capsule, Take 1 capsule (100 mg total) by mouth daily., Disp: 30 capsule, Rfl: 0 .  hydrocortisone (ANUSOL-HC) 25 MG suppository, Place 1 suppository (25 mg total) rectally 2 (two) times daily., Disp: 12 suppository, Rfl: 0 .  montelukast (  SINGULAIR) 10 MG tablet, TAKE 1 TABLET BY MOUTH AT BEDTIME, Disp: 90 tablet, Rfl: 0 .  Multiple Vitamins-Minerals (MULTIVITAMIN ADULT PO), Take by mouth., Disp: , Rfl:  .  naproxen (NAPROSYN) 250 MG tablet, Take by mouth 2 (two) times daily with a meal., Disp: , Rfl:  .  predniSONE (DELTASONE) 10 MG tablet, Take 40 mg by mouth on day 1, then taper 10 mg daily until gone, Disp: 10 tablet, Rfl: 0 .  sertraline (ZOLOFT) 100 MG tablet, Take 1 tablet by mouth once daily, Disp: 30 tablet, Rfl: 0 .  SYMBICORT 160-4.5 MCG/ACT inhaler, Inhale 2 puffs by mouth twice daily, Disp: 11 g, Rfl: 0  EXAM:  VITALS per patient if applicable: Wt Readings from Last 3 Encounters:  11/06/19 268 lb (121.6 kg)  06/19/19  270 lb (122.5 kg)  04/25/19 290 lb (131.5 kg)  Body mass index is 44.6 kg/m.    GENERAL: alert, oriented, appears well and in no acute distress  HEENT: atraumatic, conjunttiva clear, no obvious abnormalities on inspection of external nose and ears  NECK: normal movements of the head and neck  LUNGS: on inspection no signs of respiratory distress, breathing rate appears normal, no obvious gross SOB, gasping or wheezing  CV: no obvious cyanosis  MS: moves all visible extremities without noticeable abnormality  PSYCH/NEURO: pleasant and cooperative, no obvious depression or anxiety, speech and thought processing grossly intact  ASSESSMENT AND PLAN:  Discussed the following assessment and plan:  Left hand pain - Plan: Ambulatory referral to Orthopedic Surgery  Depression, major, single episode, mild ([place]) - Plan: Comprehensive metabolic panel  Obesity, unspecified classification, unspecified obesity type, unspecified whether serious comorbidity present - Plan: Amb Ref to Medical Weight Management, CBC with Differential/Platelet, VITAMIN D 25 Hydroxy (Vit-D Deficiency, Fractures), TSH, Lipid panel, Comprehensive metabolic panel, Hemoglobin A1c Problem List Items Addressed This Visit      Other   Depression, major, single episode, mild (HCC)    Doing well Wellbutrin, will continue      Relevant Orders   Comprehensive metabolic panel   Left hand pain - Primary    Chronic since injury.  Agreed at this point cif onservative management has not yet resolved pain/swelling, we will consult orthopedics       Relevant Orders   Ambulatory referral to Orthopedic Surgery   Obesity    Patient has done well on Wellbutrin in regards to weight loss.  Referral back to health and wellness for further evaluation, support here.      Relevant Orders   Amb Ref to Medical Weight Management   CBC with Differential/Platelet   VITAMIN D 25 Hydroxy (Vit-D Deficiency, Fractures)   TSH   Lipid  panel   Comprehensive metabolic panel   Hemoglobin A1c      -we discussed possible serious and likely etiologies, options for evaluation and workup, limitations of telemedicine visit vs in person visit, treatment, treatment risks and precautions. Pt prefers to treat via telemedicine empirically rather then risking or undertaking an in person visit at this moment. Patient agrees to seek prompt in person care if worsening, new symptoms arise, or if is not improving with treatment.   I discussed the assessment and treatment plan with the patient. The patient was provided an opportunity to ask questions and all were answered. The patient agreed with the plan and demonstrated an understanding of the instructions.   The patient was advised to call back or seek an in-person evaluation if the symptoms worsen or  if the condition fails to improve as anticipated.   [name], FNP

## 2019-11-06 NOTE — Assessment & Plan Note (Signed)
Patient has done well on Wellbutrin in regards to weight loss.  Referral back to health and wellness for further evaluation, support here.

## 2019-11-12 ENCOUNTER — Other Ambulatory Visit: Payer: Self-pay | Admitting: Family

## 2019-11-20 ENCOUNTER — Other Ambulatory Visit: Payer: Self-pay | Admitting: Family

## 2019-11-20 DIAGNOSIS — Z1231 Encounter for screening mammogram for malignant neoplasm of breast: Secondary | ICD-10-CM

## 2019-12-05 ENCOUNTER — Ambulatory Visit
Admission: RE | Admit: 2019-12-05 | Discharge: 2019-12-05 | Disposition: A | Discharge status: Home / Self Care | Payer: BC Managed Care – PPO | Source: Ambulatory Visit | Attending: Family | Admitting: Family

## 2019-12-05 DIAGNOSIS — Z1231 Encounter for screening mammogram for malignant neoplasm of breast: Secondary | ICD-10-CM | POA: Diagnosis not present

## 2020-02-22 ENCOUNTER — Ambulatory Visit (INDEPENDENT_AMBULATORY_CARE_PROVIDER_SITE_OTHER): Payer: BC Managed Care – PPO | Admitting: Family Medicine

## 2020-02-22 ENCOUNTER — Other Ambulatory Visit: Payer: Self-pay

## 2020-02-22 ENCOUNTER — Encounter (INDEPENDENT_AMBULATORY_CARE_PROVIDER_SITE_OTHER): Payer: Self-pay | Admitting: Family Medicine

## 2020-02-22 ENCOUNTER — Telehealth (INDEPENDENT_AMBULATORY_CARE_PROVIDER_SITE_OTHER): Payer: BC Managed Care – PPO | Admitting: Psychology

## 2020-02-22 VITALS — BP 143/88 | HR 75 | Temp 98.4°F | Ht 65.0 in | Wt 303.0 lb

## 2020-02-22 DIAGNOSIS — Z8679 Personal history of other diseases of the circulatory system: Secondary | ICD-10-CM | POA: Diagnosis not present

## 2020-02-22 DIAGNOSIS — R0602 Shortness of breath: Secondary | ICD-10-CM

## 2020-02-22 DIAGNOSIS — Z6841 Body Mass Index (BMI) 40.0 and over, adult: Secondary | ICD-10-CM

## 2020-02-22 DIAGNOSIS — F3289 Other specified depressive episodes: Secondary | ICD-10-CM | POA: Diagnosis not present

## 2020-02-22 DIAGNOSIS — R5383 Other fatigue: Secondary | ICD-10-CM | POA: Diagnosis not present

## 2020-02-22 DIAGNOSIS — G4719 Other hypersomnia: Secondary | ICD-10-CM

## 2020-02-22 DIAGNOSIS — G479 Sleep disorder, unspecified: Secondary | ICD-10-CM

## 2020-02-22 DIAGNOSIS — Z9189 Other specified personal risk factors, not elsewhere classified: Secondary | ICD-10-CM | POA: Diagnosis not present

## 2020-02-22 DIAGNOSIS — I10 Essential (primary) hypertension: Secondary | ICD-10-CM | POA: Insufficient documentation

## 2020-02-22 DIAGNOSIS — J45909 Unspecified asthma, uncomplicated: Secondary | ICD-10-CM

## 2020-02-22 DIAGNOSIS — Z0289 Encounter for other administrative examinations: Secondary | ICD-10-CM

## 2020-02-22 DIAGNOSIS — R457 State of emotional shock and stress, unspecified: Secondary | ICD-10-CM | POA: Insufficient documentation

## 2020-02-22 NOTE — Progress Notes (Signed)
  Office: [phone]  /  Fax: [phone]    Date: March 05, 2020   Appointment Start Time: 3:57pm Duration: 31 minutes Provider: Glennie Isle, Psy.D. Type of Session: Individual Therapy  Location of Patient: Work Location of Provider: Provider's Home Type of Contact: Telepsychological Visit via MyChart Video Visit  Session Content: Evaleigh is a 54 y.o. female presenting for a follow-up appointment to address the previously established treatment goal of increasing coping skills. Today's appointment was a telepsychological visit due to COVID-19. Adalida provided verbal consent for today's telepsychological appointment and she is aware she is responsible for securing confidentiality on her end of the session. Prior to proceeding with today's appointment, Allycia's physical location at the time of this appointment was obtained as well a phone number she could be reached at in the event of technical difficulties. Lakeva and this provider participated in today's telepsychological service.   This provider conducted a brief check-in. Nisa shared she is trying to "keep track around feelings about food." She described experiencing guilt, fear of overeating, and feeling not good enough since the last appointment with this provider. This provider utilized the tug of war metaphor from [employer] to increase understanding of the aforementioned. Additionally, Nychelle and this provider discussed the consequences of not eating regularly, the dieting mentality, as well as all or nothing thinking using additional metaphors. Moreover, Taiya's eating habits were explored and it was reflected she would likely benefit from a snack between lunch and dinner as she is going a long stretch of time without eating between those meals. She agreed. Fynn was receptive to today's appointment as evidenced by openness to sharing and responsiveness to feedback.  Mental Status Examination:  Appearance: well groomed and  appropriate hygiene  Behavior: appropriate to circumstances Mood: euthymic Affect: mood congruent Speech: normal in rate, volume, and tone Eye Contact: appropriate Psychomotor Activity: appropriate Gait: unable to assess Thought Process: linear, logical, and goal directed  Thought Content/Perception: no hallucinations, delusions, bizarre thinking or behavior reported or observed and no evidence of suicidal and homicidal ideation, plan, and intent Orientation: time, person, place, and purpose of appointment Memory/Concentration: memory, attention, language, and fund of knowledge intact  Insight/Judgment: fair  Interventions:  Conducted a brief chart review Provided empathic reflections and validation Reviewed content from the previous session Employed supportive psychotherapy interventions to facilitate reduced distress and to improve coping skills with identified stressors Psychoeducation provided regarding all-or-nothing thinking  DSM-5 Diagnosis(es): 311 (F32.8) Other Specified Depressive Disorder, Emotional Eating Behaviors  Treatment Goal & Progress: During the initial appointment with this provider, the following treatment goal was established: increase coping skills. Jeimy has demonstrated progress in her goal as evidenced by increased awareness of hunger patterns.   Plan: The next appointment will be scheduled in two weeks, which will be via MyChart Video Visit. The next session will focus on working towards the established treatment goal.

## 2020-02-22 NOTE — Progress Notes (Signed)
Dear Marie Solis, [place],   Thank you for referring Marie Solis to our clinic. The following note includes my evaluation and treatment recommendations.  Chief Complaint:   OBESITY Marie Solis (MR# [id number]) is a 54 y.o. female who presents for evaluation and treatment of obesity and related comorbidities. Current BMI is Body mass index is 50.42 kg/m. Marie Solis has been struggling with her weight for many years and has been unsuccessful in either losing weight, maintaining weight loss, or reaching her healthy weight goal.  Marie Solis is currently in the action stage of change and ready to dedicate time achieving and maintaining a healthier weight. Marie Solis is interested in becoming our patient and working on intensive lifestyle modifications including (but not limited to) diet and exercise for weight loss.  Marie Solis lives with her husband, Marie Solis.  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  She is 1 of 13 children in her family.  Her family are sheep farmers.  She skips lunch.  She drinks fruit smoothies.  She snacks late at night/after dinner.  She was here 2 years ago for 3 months, but stopped due to a family emergency.  Marie Solis's habits were reviewed today and are as follows: she struggles with family and or coworkers weight loss sabotage, her desired weight loss is 100 pounds, she has been heavy most of her life, she started gaining weight as a child, her heaviest weight ever was 350 pounds, she craves ice cream, chocolate, and bread, she snacks frequently in the evenings, she skips lunch frequently, she frequently makes poor food choices, she has problems with excessive hunger, she frequently eats larger portions than normal and she struggles with emotional eating.  Depression Screen Marie Solis's Food and Mood (modified PHQ-9) score was 20.  Depression screen PHQ 2/9 02/22/2020  Decreased Interest 2  Down, Depressed, Hopeless 2  PHQ - 2 Score 4  Altered  sleeping 3  Tired, decreased energy 3  Change in appetite 2  Feeling bad or failure about yourself  3  Trouble concentrating 2  Moving slowly or fidgety/restless 1  Suicidal thoughts 1  PHQ-9 Score 19  Difficult doing work/chores -   Subjective:   1. Other fatigue Marie Solis admits to daytime somnolence and reports waking up still tired. Patent has a history of symptoms of daytime fatigue, morning fatigue, morning headache and snoring. Karmah generally gets 6 hours of sleep per night, and states that she has poor quality sleep at times. Snoring is present. Apneic episodes are not present. Epworth Sleepiness Score is 10.  2. Shortness of breath on exertion Marie Solis notes increasing shortness of breath with exercising and seems to be worsening over time with weight gain. She notes getting out of breath sooner with activity than she used to. This has gotten worse recently. Marie Solis denies shortness of breath at rest or orthopnea.  3. History of hypertension Diet controlled currently.  BP Readings from Last 3 Encounters:  02/22/20 (!) 143/88  04/25/19 (!) 142/93  01/25/19 122/82   4. Reactive airway disease without complication, unspecified asthma severity, unspecified whether persistent Albuterol, Symbicort, Flonase, Singulair.  Symptoms stable currently.  5. Sleep difficulties She takes OTC melatonin as needed.  Denies issues currently.  6. Excessive daytime sleepiness Positive Epworth sleepiness scale.  Evaluated around 1 year ago with sleep study and told she needs to not sleep on her back.  7. Other depression, with emotional eating She is taking Zoloft daily.  8. At risk for  impaired metabolic function Due to Marie Solis's current state of health and medical condition(s), they are at a significantly higher risk for impaired metabolic function.   This further also puts the patient at much greater risk to also subsequently develop cardiopulmonary conditions that can negatively affect  patient's quality of life as well.  At least 8 minutes was spent on counseling Marie Solis about these concerns today and I stressed the importance of reversing these risks factors.   Initial goal is to lose at least 5-10% of starting weight to help reduce risk factors.   Counseling: Intensive lifestyle modifications discussed with Marie Solis as most appropriate first line treatment.  she will continue to work on diet, exercise and weight loss efforts.  We will continue to reassess these conditions on a fairly regular basis in an attempt to decrease patient's overall morbidity and mortality  This is the patient's first visit at [facility].  The patient's NEW PATIENT PACKET that they filled out prior to today's office visit was reviewed at length and some information from that paperwork was also included within the following office visit note.    Included in the packet: current and past health history, medications, allergies, ROS, gynecologic history (women only), surgical history, family history, social history, weight history, weight loss surgery history (for those that have had weight loss surgery), nutritional evaluation, mood and food questionnaire along with a depression screening (PHQ9) on all patients, an Epworth questionnaire, sleep habits questionnaire, patient life and health improvement goals questionnaire. These will all be scanned into the patient's chart under media.   During the visit, I independently reviewed the patient's EKG, bioimpedance scale results, and indirect calorimeter results. I used this information to tailor a meal plan for the patient that will help Marie Solis to lose weight and will improve her obesity-related conditions going forward. I performed a medically necessary appropriate examination and/or evaluation. I discussed the assessment and treatment plan with the patient. The patient was provided an opportunity to ask questions and all were answered. The patient agreed  with the plan and demonstrated an understanding of the instructions. Labs were ordered at this visit and will be reviewed at the next visit unless more critical results need to be addressed immediately. Clinical information was updated and documented in the EMR.   Time spent on visit including pre-visit chart review and post-visit care was estimated to be 60-74 minutes.  A separate 15 minutes was spent on risk counseling (see above/below).   Assessment/Plan:   1. Other fatigue Marie Solis does feel that her weight is causing her energy to be lower than it should be. Fatigue may be related to obesity, depression or many other causes. Labs will be ordered, and in the meanwhile, Marie Solis will focus on self care including making healthy food choices, increasing physical activity and focusing on stress reduction.  - EKG 12-Lead - Vitamin B12 - CBC with Differential/Platelet - COMPLETE METABOLIC PANEL WITH GFR - Folate - Hemoglobin A1c - Insulin, random - Lipid Panel With LDL/HDL Ratio - T3 - T4 - VITAMIN D 25 Hydroxy (Vit-D Deficiency, Fractures) - TSH  2. Shortness of breath on exertion Marie Solis does feel that she gets out of breath more easily that she used to when she exercises. Marie Solis's shortness of breath appears to be obesity related and exercise induced. She has agreed to work on weight loss and gradually increase exercise to treat her exercise induced shortness of breath. Will continue to monitor closely.  - Vitamin B12 - CBC  with Differential/Platelet - COMPLETE METABOLIC PANEL WITH GFR - Folate - Hemoglobin A1c - Insulin, random - Lipid Panel With LDL/HDL Ratio - T3 - T4 - VITAMIN D 25 Hydroxy (Vit-D Deficiency, Fractures) - TSH  3. History of hypertension Not at goal today.  Home blood pressure monitoring daily.  Bring in log next office visit.  Prudent nutritional plan, weight loss.  Check labs.  4. Reactive airway disease without complication, unspecified asthma severity,  unspecified whether persistent Check labs, prudent nutritional plan, weight loss.  5. Sleep difficulties Will monitor.  6. Excessive daytime sleepiness She needs to buy a sleep body pillow to help with prevention of desatting at night.  7. Other depression, with emotional eating Patient was referred to Marie Solis, our Bariatric Psychologist, for evaluation due to her elevated PHQ-9 score and significant struggles with emotional eating.  Explained to her that she may also be referred to [facility] for additional counseling.  8. At risk for impaired metabolic function Due to Marie Solis's current state of health and medical condition(s), they are at a significantly higher risk for impaired metabolic function.   This further also puts the patient at much greater risk to also subsequently develop cardiopulmonary conditions that can negatively affect patient's quality of life as well.  At least 8 minutes was spent on counseling Marie Solis about these concerns today and I stressed the importance of reversing these risks factors.   Initial goal is to lose at least 5-10% of starting weight to help reduce risk factors.   Counseling: Intensive lifestyle modifications discussed with Marie Solis as most appropriate first line treatment.  she will continue to work on diet, exercise and weight loss efforts.  We will continue to reassess these conditions on a fairly regular basis in an attempt to decrease patient's overall morbidity and mortality  9. Class 3 severe obesity with serious comorbidity and body mass index (BMI) of 50.0 to 59.9 in adult, unspecified obesity type (HCC)  Marie Solis is currently in the action stage of change and her goal is to continue with weight loss efforts. I recommend Marie Solis begin the structured treatment plan as follows:  She has agreed to the Category 2 Plan.  Exercise goals: As is.   Behavioral modification strategies: increasing lean protein intake, decreasing simple carbohydrates, decreasing  liquid calories, meal planning and cooking strategies, emotional eating strategies, avoiding temptations and planning for success.  She was informed of the importance of frequent follow-up visits to maximize her success with intensive lifestyle modifications for her multiple health conditions. She was informed we would discuss her lab results at her next visit unless there is a critical issue that needs to be addressed sooner. Marie Solis agreed to keep her next visit at the agreed upon time to discuss these results.  Objective:   Blood pressure (!) 143/88, pulse 75, temperature 98.4 F (36.9 C), height 5\' 5"  (1.651 m), weight (!) 303 lb (137.4 kg), SpO2 96 %. Body mass index is 50.42 kg/m.  EKG: Normal sinus rhythm, rate 61 bpm.  Indirect Calorimeter completed today shows a VO2 of 274 and a REE of 1910.  Her calculated basal metabolic rate is 5852 thus her basal metabolic rate is worse than expected.  General: Cooperative, alert, well developed, in no acute distress. HEENT: Conjunctivae and lids unremarkable. Cardiovascular: Regular rhythm.  Lungs: Normal work of breathing. Neurologic: No focal deficits.   Lab Results  Component Value Date   CREATININE 0.72 08/31/2017   BUN 10 08/31/2017   NA 137 08/31/2017  K 4.3 08/31/2017   CL 100 08/31/2017   CO2 22 08/31/2017   Lab Results  Component Value Date   ALT 15 08/31/2017   AST 20 08/31/2017   ALKPHOS 79 08/31/2017   BILITOT 0.5 08/31/2017   Lab Results  Component Value Date   HGBA1C 5.4 08/31/2017   HGBA1C 5.3 02/04/2017   Lab Results  Component Value Date   INSULIN 11.7 08/31/2017   Lab Results  Component Value Date   TSH 1.580 08/31/2017   Lab Results  Component Value Date   CHOL 183 08/31/2017   HDL 45 08/31/2017   LDLCALC 97 08/31/2017   TRIG 204 (H) 08/31/2017   CHOLHDL 4.1 08/31/2017   Lab Results  Component Value Date   WBC 8.0 08/08/2019   HGB 13.1 08/08/2019   HCT 40.0 08/08/2019   MCV 85.5  08/08/2019   PLT 293.0 08/08/2019   Attestation Statements:   Reviewed by clinician on day of visit: allergies, medications, problem list, medical history, surgical history, family history, social history, and previous encounter notes.  I, Water quality scientist, CMA, am acting as Location manager for [employer], DO.  I have reviewed the above documentation for accuracy and completeness, and I agree with the above. Mellody Dance, DO
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
## 2020-02-23 LAB — T4: T4, Total: 5.5 ug/dL (ref 4.5–12.0)

## 2020-02-23 LAB — CBC WITH DIFFERENTIAL/PLATELET
Basophils Absolute: 0.1 10*3/uL (ref 0.0–0.2)
Basos: 1 %
EOS (ABSOLUTE): 0.4 10*3/uL (ref 0.0–0.4)
Eos: 6 %
Hematocrit: 38.2 % (ref 34.0–46.6)
Hemoglobin: 12.8 g/dL (ref 11.1–15.9)
Immature Grans (Abs): 0 10*3/uL (ref 0.0–0.1)
Immature Granulocytes: 0 %
Lymphocytes Absolute: 1.5 10*3/uL (ref 0.7–3.1)
Lymphs: 25 %
MCH: 28.7 pg (ref 26.6–33.0)
MCHC: 33.5 g/dL (ref 31.5–35.7)
MCV: 86 fL (ref 79–97)
Monocytes Absolute: 0.4 10*3/uL (ref 0.1–0.9)
Monocytes: 6 %
Neutrophils Absolute: 3.9 10*3/uL (ref 1.4–7.0)
Neutrophils: 62 %
Platelets: 285 10*3/uL (ref 150–450)
RBC: 4.46 x10E6/uL (ref 3.77–5.28)
RDW: 13.8 % (ref 11.7–15.4)
WBC: 6.2 10*3/uL (ref 3.4–10.8)

## 2020-02-23 LAB — HEMOGLOBIN A1C
Est. average glucose Bld gHb Est-mCnc: 111 mg/dL
Hgb A1c MFr Bld: 5.5 % (ref 4.8–5.6)

## 2020-02-23 LAB — LIPID PANEL WITH LDL/HDL RATIO
Cholesterol, Total: 228 mg/dL — ABNORMAL HIGH (ref 100–199)
HDL: 56 mg/dL (ref >39)
LDL Chol Calc (NIH): 144 mg/dL — ABNORMAL HIGH (ref 0–99)
LDL/HDL Ratio: 2.6 ratio (ref 0.0–3.2)
Triglycerides: 155 mg/dL — ABNORMAL HIGH (ref 0–149)
VLDL Cholesterol Cal: 28 mg/dL (ref 5–40)

## 2020-02-23 LAB — TSH: TSH: 1.81 u[IU]/mL (ref 0.450–4.500)

## 2020-02-23 LAB — VITAMIN D 25 HYDROXY (VIT D DEFICIENCY, FRACTURES): Vit D, 25-Hydroxy: 20.7 ng/mL — ABNORMAL LOW (ref 30.0–100.0)

## 2020-02-23 LAB — T3: T3, Total: 96 ng/dL (ref 71–180)

## 2020-02-23 LAB — INSULIN, RANDOM: INSULIN: 10.9 u[IU]/mL (ref 2.6–24.9)

## 2020-02-23 LAB — FOLATE: Folate: 8.6 ng/mL (ref >3.0)

## 2020-02-23 LAB — VITAMIN B12: Vitamin B-12: 651 pg/mL (ref 232–1245)

## 2020-03-05 ENCOUNTER — Telehealth (INDEPENDENT_AMBULATORY_CARE_PROVIDER_SITE_OTHER): Payer: BC Managed Care – PPO | Admitting: Psychology

## 2020-03-05 DIAGNOSIS — F3289 Other specified depressive episodes: Secondary | ICD-10-CM | POA: Diagnosis not present

## 2020-03-05 NOTE — Progress Notes (Signed)
  Office: [phone]  /  Fax: [phone]    Date: March 19, 2020   Appointment Start Time: 4:00pm Duration: 31 minutes Provider: Glennie Isle, Psy.D. Type of Session: Individual Therapy  Location of Patient: Work Location of Provider: Provider's Home Type of Contact: Telepsychological Visit via MyChart Video Visit  Session Content: Marie Solis is a 54 y.o. female presenting for a follow-up appointment to address the previously established treatment goal of increasing coping skills. Today's appointment was a telepsychological visit due to COVID-19. Marlet provided verbal consent for today's telepsychological appointment and she is aware she is responsible for securing confidentiality on her end of the session. Prior to proceeding with today's appointment, [name]'s physical location at the time of this appointment was obtained as well a phone number she could be reached at in the event of technical difficulties. [name] and this provider participated in today's telepsychological service.   This provider conducted a brief check-in. Harjot reported she was switched to the Category 3 meal plan by Dr. Raliegh Scarlet. She acknowledged challenges with accepting a new plan with more food, as she feels "guilty eating anything." Thus, this provider and [place] discussed the dieting mentality. Additionally, [name] was engaged in thought challenging and reframing to address feelings of guilt and the thought "I'm going to mess up." She developed a balanced thought taking into account both evidence for and against the aforementioned. [name] was receptive to today's appointment as evidenced by openness to sharing, responsiveness to feedback, and willingness to engage in thought challenging as needed to assist with coping.  Mental Status Examination:  Appearance: well groomed and appropriate hygiene  Behavior: appropriate to circumstances Mood: euthymic Affect: mood congruent Speech: normal in rate, volume,  and tone Eye Contact: appropriate Psychomotor Activity: appropriate Gait: unable to assess Thought Process: linear, logical, and goal directed  Thought Content/Perception: no hallucinations, delusions, bizarre thinking or behavior reported or observed and no evidence of suicidal and homicidal ideation, plan, and intent Orientation: time, person, place, and purpose of appointment Memory/Concentration: memory, attention, language, and fund of knowledge intact  Insight/Judgment: fair  Interventions:  Conducted a brief chart review Provided empathic reflections and validation Employed supportive psychotherapy interventions to facilitate reduced distress and to improve coping skills with identified stressors Engaged patient in thought challenging/cognitive restructuring   DSM-5 Diagnosis(es): 311 (F32.8) Other Specified Depressive Disorder, Emotional Eating Behaviors  Treatment Goal & Progress: During the initial appointment with this provider, the following treatment goal was established: increase coping skills. [name] has demonstrated progress in her goal as evidenced by increased awareness of hunger patterns. [name] also continues to demonstrate willingness to engage in learned skill(s).  Plan: The next appointment will be scheduled in two weeks, which will be via MyChart Video Visit. The next session will focus on working towards the established treatment goal.

## 2020-03-07 ENCOUNTER — Other Ambulatory Visit: Payer: Self-pay

## 2020-03-07 ENCOUNTER — Ambulatory Visit (INDEPENDENT_AMBULATORY_CARE_PROVIDER_SITE_OTHER): Payer: BC Managed Care – PPO | Admitting: Family Medicine

## 2020-03-07 ENCOUNTER — Encounter (INDEPENDENT_AMBULATORY_CARE_PROVIDER_SITE_OTHER): Payer: Self-pay | Admitting: Family Medicine

## 2020-03-07 VITALS — BP 137/78 | HR 61 | Temp 98.0°F | Ht 65.0 in | Wt 286.0 lb

## 2020-03-07 DIAGNOSIS — R030 Elevated blood-pressure reading, without diagnosis of hypertension: Secondary | ICD-10-CM | POA: Diagnosis not present

## 2020-03-07 DIAGNOSIS — R7303 Prediabetes: Secondary | ICD-10-CM

## 2020-03-07 DIAGNOSIS — Z9189 Other specified personal risk factors, not elsewhere classified: Secondary | ICD-10-CM | POA: Diagnosis not present

## 2020-03-07 DIAGNOSIS — E559 Vitamin D deficiency, unspecified: Secondary | ICD-10-CM

## 2020-03-07 DIAGNOSIS — Z6841 Body Mass Index (BMI) 40.0 and over, adult: Secondary | ICD-10-CM

## 2020-03-07 DIAGNOSIS — E7849 Other hyperlipidemia: Secondary | ICD-10-CM | POA: Diagnosis not present

## 2020-03-07 MED ORDER — VITAMIN D (ERGOCALCIFEROL) 1.25 MG (50000 UNIT) PO CAPS: 50000.0000 [IU] | ORAL_CAPSULE | ORAL | 0 refills | Status: DC

## 2020-03-07 NOTE — Patient Instructions (Signed)
The 10-year ASCVD risk score Mikey Bussing DC Brooke Bonito., et al., 2013) is: 2.6%   Values used to calculate the score:     Age: 54 years     Sex: Female     Is Non-Hispanic African American: No     Diabetic: No     Tobacco smoker: No     Systolic Blood Pressure: 672 mmHg     Is BP treated: No     HDL Cholesterol: 56 mg/dL     Total Cholesterol: 228 mg/dL

## 2020-03-12 NOTE — Progress Notes (Signed)
Chief Complaint:   OBESITY Marie Solis is here to discuss her progress with her obesity treatment plan along with follow-up of her obesity related diagnoses. Marie Solis is on the Category 2 Plan and states she is following her eating plan approximately 50% of the time. Marie Solis states she is walking for 30 minutes 5 times per week.  Today's visit was #: 2 Starting weight: 303 lbs Starting date: 02/22/2020 Today's weight: 286 lbs Today's date: 03/07/2020 Total lbs lost to date: 17 lbs Total lbs lost since last in-office visit: 17 lbs  Interim History:  Patient is here today to review NEW Meal PLAN and to discuss all recent labs done here and/ or done at outside facilities.  Extended time was spent counseling Marie Solis on all new disease processes that were discovered or that are worsening.   Marie Solis is surprised she lost weight because 50% of the time she did not follow the plan.    She never ate all foods on plan in a day.    "One night I ate all the Skinny Cows in the freezer".    She significantly cut out sugars/candy and pastries.    For breakfast, she has been having 1 protein bar only at 180-190 calories with 12-16 grams of protein and 13-*17 net carbs plus a [place] yogurt.   For lunch, microwave meal, but not with adequate protein. ( 20 +)   For dinner, she is not weighing her proteins or measuring veggies    Assessment/Plan:   1. Elevated blood pressure reading Discussed labs with patient today.   History of hypertension.  Diet controlled.   High last office visit.    Home blood pressure monitoring shows blood pressures of 135/84, pulse 75, 137/88, pulse 68, 132/82, pulse 75, 135/82, pulse 68.  Review: taking medications as instructed, no medication side effects noted, no chest pain on exertion, no dyspnea on exertion, no swelling of ankles.   Plan:  At goal today.  Continue prudent nutritional plan, weight loss, low salt.   BP Readings from Last 3  Encounters:  03/07/20 137/78  02/22/20 (!) 143/88  04/25/19 (!) 142/93     2. Other hyperlipidemia New.    Discussed labs with patient today.    Marie Solis's 10 year risk is 2.6%.  She is not currently on a statin.   Plan:  Continue prudent nutritional plan.  Lower trans/sat fats in diet.  Continue weight loss.  The 10-year ASCVD risk score Marie Bussing DC Brooke Bonito., et al., 2013) is: 2.6%   Values used to calculate the score:     Age: 54 years     Sex: Female     Is Non-Hispanic African American: No     Diabetic: No     Tobacco smoker: No     Systolic Blood Pressure: 740 mmHg     Is BP treated: No     HDL Cholesterol: 56 mg/dL     Total Cholesterol: 228 mg/dL  Lab Results  Component Value Date   ALT 15 08/31/2017   AST 20 08/31/2017   ALKPHOS 79 08/31/2017   BILITOT 0.5 08/31/2017   Lab Results  Component Value Date   CHOL 228 (H) 02/22/2020   HDL 56 02/22/2020   LDLCALC 144 (H) 02/22/2020   TRIG 155 (H) 02/22/2020   CHOLHDL 4.1 08/31/2017     3. Vitamin D deficiency NEW  Discussed labs with patient today.    Marie Solis has a history of Vitamin D deficiency with  resultant generalized fatigue as her primary symptom.  she is taking no vitamin D supplement for this deficiency and tolerating it well without side-effect.   Most recent Vitamin D lab reviewed-  level: 20.7.  Plan:   - Discussed importance of vitamin D (as well as calcium) to their health and well-being.   - We reviewed possible symptoms of low Vitamin D including low energy, depressed mood, muscle aches, joint aches, osteoporosis etc.  - We discussed that low Vitamin D levels may be linked to an increased risk of cardiovascular events and even increased risk of cancers- such as colon and breast.   - Educated pt that weight loss will likely improve availability of vitamin D, thus encouraged Marie Solis to continue with meal plan and their weight loss efforts to further improve this condition  - I recommend pt take a  weekly prescription vit D- see script below- which pt agrees to after discussion of risks and benefits of this medication.      - Informed patient this may be a lifelong thing, and she was encouraged to continue to take the medicine until pt told otherwise.   We will need to monitor levels regularly ( q 3-4 mo on average )  to keep levels within normal limits.   - All pt's questions and concerns regarding this condition addressed  -Start Vitamin D, Ergocalciferol, (DRISDOL) 1.25 MG (50000 UNIT) CAPS capsule; Take 1 capsule (50,000 Units total) by mouth every 7 (seven) days.  Dispense: 4 capsule; Refill: 0    4. Prediabetes  Discussed labs with patient today.    She says she was diagnosed with this a few years ago.  Positive insulin resistance.  Marie Solis has a diagnosis of prediabetes based on her elevated HgA1c and was informed this puts her at greater risk of developing diabetes. She continues to work on diet and exercise to decrease her risk of diabetes. She denies nausea or hypoglycemia.  She is on no medication.  Plan:   - I counseled patient on pathophysiology of the disease process of Pre-DM.   Stressed importance of dietary and lifestyle modifications resulting in weight loss as first line, in addition to discussing the risks and benefits of metformin and various other medication options which can help [place] in the management of this disease process.   - Pt agreed to treatment plan today which was conceived using shared medical decision making.     - Handouts provided after education provided and all concerns/questions addressed.    - Anticipatory guidance given.   - Recheck A1c and fasting insulin level in approximately 3 months from last check or as deemed fit.    Lab Results  Component Value Date   HGBA1C 5.5 02/22/2020   Lab Results  Component Value Date   INSULIN 10.9 02/22/2020   INSULIN 11.7 08/31/2017     5. At risk for diabetes mellitus - Marie Solis was given  extensive diabetes prevention education and counseling today of more than 22+ minutes.  - Counseled patient on pathophysiology of disease and discussed various treatment options which always includes dietary and lifestyle modification as first line.   - Importance of healthy diet with very limited amounts of simple carbohydrates discussed with patient in addition to regular aerobic exercise to an eventual goal of 74min 5d/week or more.  - Handouts provided at patient's desire and or told to go online at the American Diabetes Association website for further information   6. Class 3 severe obesity with serious comorbidity  and body mass index (BMI) of 45.0 to 49.9 in adult, unspecified obesity type (HCC)  Marie Solis is currently in the action stage of change. As such, her goal is to continue with weight loss efforts. She has agreed to the Category 3 Plan.   Exercise goals: As is.  Behavioral modification strategies: increasing lean protein intake, meal planning and cooking strategies, keeping healthy foods in the home and planning for success.  Marie Solis has agreed to follow-up with our clinic in 2-3 weeks. She was informed of the importance of frequent follow-up visits to maximize her success with intensive lifestyle modifications for her multiple health conditions.    Objective:   Blood pressure 137/78, pulse 61, temperature 98 F (36.7 C), height 5\' 5"  (1.651 m), weight 286 lb (129.7 kg), SpO2 97 %. Body mass index is 47.59 kg/m.  General: Cooperative, alert, well developed, in no acute distress. HEENT: Conjunctivae and lids unremarkable. Cardiovascular: Regular rhythm.  Lungs: Normal work of breathing. Neurologic: No focal deficits.   Lab Results  Component Value Date   CREATININE 0.72 08/31/2017   BUN 10 08/31/2017   NA 137 08/31/2017   K 4.3 08/31/2017   CL 100 08/31/2017   CO2 22 08/31/2017   Lab Results  Component Value Date   ALT 15 08/31/2017   AST 20 08/31/2017   ALKPHOS  79 08/31/2017   BILITOT 0.5 08/31/2017   Lab Results  Component Value Date   HGBA1C 5.5 02/22/2020   HGBA1C 5.4 08/31/2017   HGBA1C 5.3 02/04/2017   Lab Results  Component Value Date   INSULIN 10.9 02/22/2020   INSULIN 11.7 08/31/2017   Lab Results  Component Value Date   TSH 1.810 02/22/2020   Lab Results  Component Value Date   CHOL 228 (H) 02/22/2020   HDL 56 02/22/2020   LDLCALC 144 (H) 02/22/2020   TRIG 155 (H) 02/22/2020   CHOLHDL 4.1 08/31/2017   Lab Results  Component Value Date   WBC 6.2 02/22/2020   HGB 12.8 02/22/2020   HCT 38.2 02/22/2020   MCV 86 02/22/2020   PLT 285 02/22/2020   Attestation Statements:   Reviewed by clinician on day of visit: allergies, medications, problem list, medical history, surgical history, family history, social history, and previous encounter notes.  I, Water quality scientist, CMA, am acting as Location manager for [employer], DO.  I have reviewed the above documentation for accuracy and completeness, and I agree with the above. Marjory Sneddon, D.O.  The [place] was signed into law in 2016 which includes the topic of electronic health records.  This provides immediate access to information in MyChart.  This includes consultation notes, operative notes, office notes, lab results and pathology reports.  If you have any questions about what you read please let us know at your next visit so we can discuss your concerns and take corrective action if need be.  We are right here with you.

## 2020-03-19 ENCOUNTER — Telehealth (INDEPENDENT_AMBULATORY_CARE_PROVIDER_SITE_OTHER): Payer: BC Managed Care – PPO | Admitting: Psychology

## 2020-03-19 ENCOUNTER — Other Ambulatory Visit: Payer: Self-pay | Admitting: Family

## 2020-03-19 DIAGNOSIS — F3289 Other specified depressive episodes: Secondary | ICD-10-CM

## 2020-03-19 DIAGNOSIS — J40 Bronchitis, not specified as acute or chronic: Secondary | ICD-10-CM

## 2020-03-19 DIAGNOSIS — J4531 Mild persistent asthma with (acute) exacerbation: Secondary | ICD-10-CM

## 2020-03-19 NOTE — Progress Notes (Signed)
  Office: [phone]  /  Fax: [phone]    Date: April 02, 2020   Appointment Start Time: 1:59pm Duration: 32 minutes Provider: Glennie Isle, Psy.D. Type of Session: Individual Therapy  Location of Patient: Work Location of Provider: Provider's Home Type of Contact: Telepsychological Visit via MyChart Video Visit  Session Content: Marie Solis is a 54 y.o. female presenting for a follow-up appointment to address the previously established treatment goal of increasing coping skills. Today's appointment was a telepsychological visit due to COVID-19. Jereline provided verbal consent for today's telepsychological appointment and she is aware she is responsible for securing confidentiality on her end of the session. Prior to proceeding with today's appointment, [name]'s physical location at the time of this appointment was obtained as well a phone number she could be reached at in the event of technical difficulties. [name] and this provider participated in today's telepsychological service.   This provider conducted a brief check-in. Briceyda discussed having some "ups" and "downs" with her eating since the last appointment. She discussed recognizing instances of emotional eating. This was further explored. She recognized instances of putting herself on the back burner, which has impacted mood and subsequent eating. Thus, psychoeducation regarding the importance of self-care utilizing the oxygen mask metaphor was provided. Moreover, psychoeducation regarding pleasurable activities, including its impact on emotional eating and overall well-being was provided. Cola was provided with a handout with various options of pleasurable activities, and was encouraged to engage in one activity a day and additional activities as needed when triggered to emotionally eat. [name] agreed. Jyra provided verbal consent during today's appointment for this provider to send a handout with pleasurable activities via  e-mail. She noted, "I do think that will be good." [name] was receptive to today's appointment as evidenced by openness to sharing, responsiveness to feedback, and willingness to engage in pleasurable activities to assist with coping.  Mental Status Examination:  Appearance: well groomed and appropriate hygiene  Behavior: appropriate to circumstances Mood: euthymic Affect: mood congruent Speech: normal in rate, volume, and tone Eye Contact: appropriate Psychomotor Activity: appropriate Gait: unable to assess Thought Process: linear, logical, and goal directed  Thought Content/Perception: no hallucinations, delusions, bizarre thinking or behavior reported or observed and no evidence of suicidal and homicidal ideation, plan, and intent Orientation: time, person, place, and purpose of appointment Memory/Concentration: memory, attention, language, and fund of knowledge intact  Insight/Judgment: fair  Interventions:  Conducted a brief chart review Provided empathic reflections and validation Employed supportive psychotherapy interventions to facilitate reduced distress and to improve coping skills with identified stressors Psychoeducation provided regarding pleasurable activities Psychoeducation provided regarding self-care Recommended/discussed option for longer-term therapeutic services  DSM-5 Diagnosis(es): 311 (F32.8) Other Specified Depressive Disorder, Emotional Eating Behaviors  Treatment Goal & Progress: During the initial appointment with this provider, the following treatment goal was established: increase coping skills. [name] has demonstrated progress in her goal as evidenced by increased awareness of hunger patterns. [name] also continues to demonstrate willingness to engage in learned skill(s).  Plan: The next appointment will be scheduled in approximately one week, which will be via MyChart Video Visit. The next session will focus on working towards the established treatment  goal and termination. Additionally, [name] provided verbal consent for this provider to place a referral with [facility] to address ongoing stressors/coping skills.

## 2020-03-21 ENCOUNTER — Encounter (INDEPENDENT_AMBULATORY_CARE_PROVIDER_SITE_OTHER): Payer: Self-pay | Admitting: Family Medicine

## 2020-03-21 ENCOUNTER — Ambulatory Visit (INDEPENDENT_AMBULATORY_CARE_PROVIDER_SITE_OTHER): Payer: BC Managed Care – PPO | Admitting: Family Medicine

## 2020-03-21 ENCOUNTER — Other Ambulatory Visit: Payer: Self-pay

## 2020-03-21 VITALS — BP 123/74 | HR 61 | Temp 97.5°F | Ht 65.0 in | Wt 282.0 lb

## 2020-03-21 DIAGNOSIS — E559 Vitamin D deficiency, unspecified: Secondary | ICD-10-CM | POA: Diagnosis not present

## 2020-03-25 NOTE — Progress Notes (Signed)
Chief Complaint:   OBESITY Marie Solis is here to discuss her progress with her obesity treatment plan along with follow-up of her obesity related diagnoses. Marie Solis is on the Category 3 Plan and states she is following her eating plan approximately 70% of the time. Marie Solis states she is walking for 40 minutes 7 times per week.  Today's visit was #: 3 Starting weight: 303 lbs Starting date: 02/22/2020 Today's weight: 282 lbs Today's date: 03/21/2020 Total lbs lost to date: 21 lbs Total lbs lost since last in-office visit: 4 lbs Total weight loss percentage to date: -6.93%  Interim History:  Marie Solis says that most of the time, she was able to get her protein in this time around.  For 30% of the time, she ate more carbs or more of the snack-type calories.   She ate a lot more vegetables this time   Assessment/Plan:   1. Vitamin D deficiency  Keina Mutch has a history of Vitamin D deficiency with resultant generalized fatigue as her primary symptom.   Most recent Vitamin D lab reviewed-  level: 20.7 on 02/22/2020.  She was started on this at last office visit, but has not started taking it yet, though she has picked it up from the pharmacy.  She will start taking it.  Plan:  - obtain vit D from pharmacy and take as written!! - reck levels around Feb 1st or so after she is taking it 66mo or so - Reiterated importance of vitamin D (as well as calcium) to their health and wellbeing.  - Reminded pt that weight loss will likely improve availability of vitamin D, thus encouraged Marie Solis to continue with meal plan and their weight loss efforts to further improve this condition. - I recommend pt continue to take weekly prescription vit D 50,000 IU - Informed patient this may be a lifelong thing, and she was encouraged to continue to take the medicine until told otherwise.   - We will need to monitor levels regularly (every 3-4 mo on average) to keep levels within normal limits.  - pt's  questions and concerns regarding this condition addressed.    2. Class 3 severe obesity with serious comorbidity and body mass index (BMI) of 45.0 to 49.9 in adult, unspecified obesity type (HCC)  Marie Solis is currently in the action stage of change. As such, her goal is to continue with weight loss efforts. She has agreed to the Category 3 Plan.   Exercise goals: Walking for 40 minutes 4 days per week.  Behavioral modification strategies: increasing lean protein intake, decreasing simple carbohydrates, meal planning and cooking strategies, keeping healthy foods in the home and planning for success.  Marie Solis has agreed to follow-up with our clinic in 2 weeks.   She was informed of the importance of frequent follow-up visits to maximize her success with intensive lifestyle modifications for her multiple health conditions.     Objective:   Blood pressure 123/74, pulse 61, temperature (!) 97.5 F (36.4 C), height 5\' 5"  (1.651 m), weight 282 lb (127.9 kg), SpO2 96 %. Body mass index is 46.93 kg/m.  General: Cooperative, alert, well developed, in no acute distress. HEENT: Conjunctivae and lids unremarkable. Cardiovascular: Regular rhythm.  Lungs: Normal work of breathing. Neurologic: No focal deficits.   Lab Results  Component Value Date   CREATININE 0.72 08/31/2017   BUN 10 08/31/2017   NA 137 08/31/2017   K 4.3 08/31/2017   CL 100 08/31/2017   CO2 22 08/31/2017  Lab Results  Component Value Date   ALT 15 08/31/2017   AST 20 08/31/2017   ALKPHOS 79 08/31/2017   BILITOT 0.5 08/31/2017   Lab Results  Component Value Date   HGBA1C 5.5 02/22/2020   HGBA1C 5.4 08/31/2017   HGBA1C 5.3 02/04/2017   Lab Results  Component Value Date   INSULIN 10.9 02/22/2020   INSULIN 11.7 08/31/2017   Lab Results  Component Value Date   TSH 1.810 02/22/2020   Lab Results  Component Value Date   CHOL 228 (H) 02/22/2020   HDL 56 02/22/2020   LDLCALC 144 (H) 02/22/2020   TRIG 155  (H) 02/22/2020   CHOLHDL 4.1 08/31/2017   Lab Results  Component Value Date   WBC 6.2 02/22/2020   HGB 12.8 02/22/2020   HCT 38.2 02/22/2020   MCV 86 02/22/2020   PLT 285 02/22/2020   Attestation Statements:   Reviewed by clinician on day of visit: allergies, medications, problem list, medical history, surgical history, family history, social history, and previous encounter notes.  Time spent on visit including pre-visit chart review and post-visit care and charting was 30 minutes.   I, Water quality scientist, CMA, am acting as Location manager for [employer], DO.  I have reviewed the above documentation for accuracy and completeness, and I agree with the above. Marjory Sneddon, D.O.  The [place] was signed into law in 2016 which includes the topic of electronic health records.  This provides immediate access to information in MyChart.  This includes consultation notes, operative notes, office notes, lab results and pathology reports.  If you have any questions about what you read please let us know at your next visit so we can discuss your concerns and take corrective action if need be.  We are right here with you.

## 2020-04-01 ENCOUNTER — Encounter (INDEPENDENT_AMBULATORY_CARE_PROVIDER_SITE_OTHER): Payer: Self-pay

## 2020-04-02 ENCOUNTER — Telehealth (INDEPENDENT_AMBULATORY_CARE_PROVIDER_SITE_OTHER): Payer: BC Managed Care – PPO | Admitting: Psychology

## 2020-04-02 DIAGNOSIS — F3289 Other specified depressive episodes: Secondary | ICD-10-CM | POA: Diagnosis not present

## 2020-04-04 ENCOUNTER — Ambulatory Visit (INDEPENDENT_AMBULATORY_CARE_PROVIDER_SITE_OTHER): Payer: BC Managed Care – PPO | Admitting: Family Medicine

## 2020-04-09 ENCOUNTER — Encounter (INDEPENDENT_AMBULATORY_CARE_PROVIDER_SITE_OTHER): Payer: Self-pay

## 2020-04-10 ENCOUNTER — Telehealth (INDEPENDENT_AMBULATORY_CARE_PROVIDER_SITE_OTHER): Payer: Self-pay | Admitting: Psychology

## 2020-04-11 ENCOUNTER — Ambulatory Visit (INDEPENDENT_AMBULATORY_CARE_PROVIDER_SITE_OTHER): Payer: BC Managed Care – PPO | Admitting: Family Medicine

## 2020-04-11 ENCOUNTER — Encounter (INDEPENDENT_AMBULATORY_CARE_PROVIDER_SITE_OTHER): Payer: Self-pay

## 2020-04-30 ENCOUNTER — Telehealth (INDEPENDENT_AMBULATORY_CARE_PROVIDER_SITE_OTHER): Payer: BC Managed Care – PPO | Admitting: Family Medicine

## 2020-04-30 ENCOUNTER — Encounter (INDEPENDENT_AMBULATORY_CARE_PROVIDER_SITE_OTHER): Payer: Self-pay | Admitting: Family Medicine

## 2020-04-30 ENCOUNTER — Other Ambulatory Visit: Payer: Self-pay

## 2020-04-30 ENCOUNTER — Telehealth (INDEPENDENT_AMBULATORY_CARE_PROVIDER_SITE_OTHER): Payer: Self-pay

## 2020-04-30 ENCOUNTER — Encounter (INDEPENDENT_AMBULATORY_CARE_PROVIDER_SITE_OTHER): Payer: Self-pay

## 2020-04-30 VITALS — Ht 65.0 in | Wt 283.0 lb

## 2020-04-30 DIAGNOSIS — Z6841 Body Mass Index (BMI) 40.0 and over, adult: Secondary | ICD-10-CM

## 2020-04-30 DIAGNOSIS — E559 Vitamin D deficiency, unspecified: Secondary | ICD-10-CM | POA: Diagnosis not present

## 2020-04-30 DIAGNOSIS — Z9189 Other specified personal risk factors, not elsewhere classified: Secondary | ICD-10-CM | POA: Insufficient documentation

## 2020-04-30 MED ORDER — VITAMIN D (ERGOCALCIFEROL) 1.25 MG (50000 UNIT) PO CAPS: 50000.0000 [IU] | ORAL_CAPSULE | ORAL | 0 refills | Status: DC

## 2020-04-30 NOTE — Telephone Encounter (Signed)
Verbal consent obtained to conduct video visit, via telehealth 

## 2020-04-30 NOTE — Telephone Encounter (Signed)
Call to patient to get consent for telehealth, no answer, left message to call back.

## 2020-05-01 NOTE — Progress Notes (Signed)
TeleHealth Visit:  Due to the COVID-19 pandemic, this visit was completed with telemedicine (audio/video) technology to reduce patient and provider exposure as well as to preserve personal protective equipment.   Marie Solis has verbally consented to this TeleHealth visit. The patient is located at home, the provider is located at the [facility] office. The participants in this visit include the listed provider and patient. The visit was conducted today via video.   Chief Complaint: OBESITY Marie Solis is here to discuss her progress with her obesity treatment plan along with follow-up of her obesity related diagnoses. Marie Solis is on the Category 3 Plan and states she is following her eating plan approximately 50% of the time. Marie Solis states she is walking 45-60 minutes 5 times per week.  Today's visit was #: 4 Starting weight: 303 lbs Starting date: 02/22/2020  Interim History: Marie Solis reports that her husband is in the hospital, hence her increased stress. She was exposed to [place] over the holidays and is in quarantine until Dec 15. She states with the holidays and traveling, it was a challenge to stay on the plan. She is back on the plan since yesterday. She did hit goal of walking 40 minutes 4 days a week since last OV at the end of Oct.   Plan: We discussed breakfast options but due to video visit, I was unable to give handouts to patient.  Assessment/Plan:   1. Vitamin D deficiency Marie Solis's Vitamin D level was 20.7 on 02/22/2020. She is currently taking prescription vitamin D 50,000 IU each week. She denies nausea, vomiting or muscle weakness.  Plan: Refill Vit D for 1 month, as per below. Low Vitamin D level contributes to fatigue and are associated with obesity, breast, and colon cancer. She agrees to continue to take prescription Vitamin D @50 ,000 IU every week and will follow-up for routine testing of Vitamin D, at least 2-3 times per year to avoid  over-replacement.  Refill- Vitamin D, Ergocalciferol, (DRISDOL) 1.25 MG (50000 UNIT) CAPS capsule; Take 1 capsule (50,000 Units total) by mouth every 7 (seven) days.  Dispense: 4 capsule; Refill: 0  2. Class 3 severe obesity with serious comorbidity and body mass index (BMI) of 45.0 to 49.9 in adult, unspecified obesity type (HCC) Marie Solis is currently in the action stage of change. As such, her goal is to continue with weight loss efforts. She has agreed to the Category 3 Plan.   Exercise goals: As is- continue 40 minutes 4 days a week or more  Behavioral modification strategies: meal planning and cooking strategies, holiday eating strategies  and planning for success.  Marie Solis has agreed to follow-up with our clinic in 2-4 weeks. She was informed of the importance of frequent follow-up visits to maximize her success with intensive lifestyle modifications for her multiple health conditions.  Objective:   VITALS: Per patient if applicable, see vitals. GENERAL: Alert and in no acute distress. CARDIOPULMONARY: No increased WOB. Speaking in clear sentences.  PSYCH: Pleasant and cooperative. Speech normal rate and rhythm. Affect is appropriate. Insight and judgement are appropriate. Attention is focused, linear, and appropriate.  NEURO: Oriented as arrived to appointment on time with no prompting.   Lab Results  Component Value Date   CREATININE 0.72 08/31/2017   BUN 10 08/31/2017   NA 137 08/31/2017   K 4.3 08/31/2017   CL 100 08/31/2017   CO2 22 08/31/2017   Lab Results  Component Value Date   ALT 15 08/31/2017   AST 20  08/31/2017   ALKPHOS 79 08/31/2017   BILITOT 0.5 08/31/2017   Lab Results  Component Value Date   HGBA1C 5.5 02/22/2020   HGBA1C 5.4 08/31/2017   HGBA1C 5.3 02/04/2017   Lab Results  Component Value Date   INSULIN 10.9 02/22/2020   INSULIN 11.7 08/31/2017   Lab Results  Component Value Date   TSH 1.810 02/22/2020   Lab Results  Component Value Date    CHOL 228 (H) 02/22/2020   HDL 56 02/22/2020   LDLCALC 144 (H) 02/22/2020   TRIG 155 (H) 02/22/2020   CHOLHDL 4.1 08/31/2017   Lab Results  Component Value Date   WBC 6.2 02/22/2020   HGB 12.8 02/22/2020   HCT 38.2 02/22/2020   MCV 86 02/22/2020   PLT 285 02/22/2020    Attestation Statements:   Reviewed by clinician on day of visit: allergies, medications, problem list, medical history, surgical history, family history, social history, and previous encounter notes.  Coral Ceo, am acting as Location manager for [employer], DO.  I have reviewed the above documentation for accuracy and completeness, and I agree with the above. Marjory Sneddon, D.O.  The [place] was signed into law in 2016 which includes the topic of electronic health records.  This provides immediate access to information in MyChart.  This includes consultation notes, operative notes, office notes, lab results and pathology reports.  If you have any questions about what you read please let us know at your next visit so we can discuss your concerns and take corrective action if need be.  We are right here with you.

## 2020-05-10 ENCOUNTER — Ambulatory Visit: Payer: BC Managed Care – PPO | Admitting: Psychology

## 2020-06-03 ENCOUNTER — Other Ambulatory Visit: Payer: Self-pay | Admitting: Family

## 2020-06-03 DIAGNOSIS — J40 Bronchitis, not specified as acute or chronic: Secondary | ICD-10-CM

## 2020-06-03 DIAGNOSIS — J309 Allergic rhinitis, unspecified: Secondary | ICD-10-CM

## 2020-06-04 ENCOUNTER — Ambulatory Visit (INDEPENDENT_AMBULATORY_CARE_PROVIDER_SITE_OTHER): Payer: BC Managed Care – PPO | Admitting: Family Medicine

## 2020-06-04 ENCOUNTER — Other Ambulatory Visit: Payer: Self-pay

## 2020-06-04 DIAGNOSIS — J309 Allergic rhinitis, unspecified: Secondary | ICD-10-CM

## 2020-06-04 DIAGNOSIS — J40 Bronchitis, not specified as acute or chronic: Secondary | ICD-10-CM

## 2020-06-04 MED ORDER — MONTELUKAST SODIUM 10 MG PO TABS: 10.0000 mg | ORAL_TABLET | Freq: Every day | ORAL | 3 refills | Status: DC

## 2020-06-11 ENCOUNTER — Encounter (INDEPENDENT_AMBULATORY_CARE_PROVIDER_SITE_OTHER): Payer: Self-pay | Admitting: Family Medicine

## 2020-06-12 ENCOUNTER — Ambulatory Visit (INDEPENDENT_AMBULATORY_CARE_PROVIDER_SITE_OTHER): Payer: Self-pay | Admitting: Family Medicine

## 2020-06-26 ENCOUNTER — Other Ambulatory Visit: Payer: Self-pay | Admitting: Family

## 2020-07-03 ENCOUNTER — Ambulatory Visit: Payer: BC Managed Care – PPO | Admitting: Family

## 2020-07-07 ENCOUNTER — Other Ambulatory Visit: Payer: Self-pay | Admitting: Family

## 2020-07-07 DIAGNOSIS — J309 Allergic rhinitis, unspecified: Secondary | ICD-10-CM

## 2020-07-07 DIAGNOSIS — J40 Bronchitis, not specified as acute or chronic: Secondary | ICD-10-CM

## 2020-07-08 ENCOUNTER — Ambulatory Visit (INDEPENDENT_AMBULATORY_CARE_PROVIDER_SITE_OTHER): Payer: Self-pay | Admitting: Family Medicine

## 2020-07-08 ENCOUNTER — Ambulatory Visit (INDEPENDENT_AMBULATORY_CARE_PROVIDER_SITE_OTHER): Payer: Self-pay | Admitting: Adult Health

## 2020-07-10 ENCOUNTER — Other Ambulatory Visit: Payer: Self-pay | Admitting: Family

## 2020-07-10 ENCOUNTER — Ambulatory Visit (INDEPENDENT_AMBULATORY_CARE_PROVIDER_SITE_OTHER): Payer: Self-pay | Admitting: Family Medicine

## 2020-07-10 DIAGNOSIS — J40 Bronchitis, not specified as acute or chronic: Secondary | ICD-10-CM

## 2020-07-10 DIAGNOSIS — J4531 Mild persistent asthma with (acute) exacerbation: Secondary | ICD-10-CM

## 2020-07-29 ENCOUNTER — Other Ambulatory Visit: Payer: Self-pay

## 2020-07-29 ENCOUNTER — Ambulatory Visit: Payer: BC Managed Care – PPO | Admitting: Family

## 2020-07-29 ENCOUNTER — Encounter: Payer: Self-pay | Admitting: Family

## 2020-07-29 VITALS — BP 116/80 | HR 65 | Temp 98.1°F | Ht 65.0 in | Wt 293.8 lb

## 2020-07-29 DIAGNOSIS — J45909 Unspecified asthma, uncomplicated: Secondary | ICD-10-CM | POA: Diagnosis not present

## 2020-07-29 DIAGNOSIS — F320 Major depressive disorder, single episode, mild: Secondary | ICD-10-CM | POA: Diagnosis not present

## 2020-07-29 DIAGNOSIS — J3089 Other allergic rhinitis: Secondary | ICD-10-CM | POA: Diagnosis not present

## 2020-07-29 DIAGNOSIS — E669 Obesity, unspecified: Secondary | ICD-10-CM

## 2020-07-29 MED ORDER — WEGOVY 0.25 MG/0.5ML ~~LOC~~ SOAJ: 0.2500 mg | SUBCUTANEOUS | 2 refills | Status: DC

## 2020-07-29 MED ORDER — AZELASTINE-FLUTICASONE 137-50 MCG/ACT NA SUSP: 1.0000 | Freq: Two times a day (BID) | NASAL | 5 refills | Status: AC

## 2020-07-29 NOTE — Progress Notes (Unsigned)
Subjective:    Patient ID: Marie Solis, female    DOB: 1965-07-17, 55 y.o.   MRN: [id number]  CC: Marie Solis is a 55 y.o. female who presents today for follow up.   HPI: She remains  frustrated by weight. She is eating healthier. Walks 45 minutes 3 days per week.  Endorses emotional eating. Would like referral to counseling as she as seen in the past.  Has tried wellbutrin in the past without results.  No personal or family  h/o thyroid cancer. No trouble swallowing  Depression- she stopped wellbutrin on her own as didn't think she needed and not sure if made her feel more anxious. Feels well on zoloft 100mg  and thinks adequate dose. No si/hi.   Asthma- occassional wheezing which is at baseline for her. Pollen is trigger . Would like refill for dymista.       HISTORY:  Past Medical History:  Diagnosis Date  . Arthritis   . Asthma   . Back pain   . Colon polyps   . Cough   . Hypertension   . Joint pain   . Numbness in both hands   . Pre-diabetes   . Rheumatoid aortitis   . Shortness of breath   . Trouble in sleeping   . Vitamin B12 deficiency    Past Surgical History:  Procedure Laterality Date  . COLONOSCOPY    . COLONOSCOPY WITH PROPOFOL N/A 01/07/2017   Procedure: COLONOSCOPY WITH PROPOFOL;  Surgeon: Lin Landsman, MD;  Location: [facility] ENDOSCOPY;  Service: Endoscopy;  Laterality: N/A;  . DILATION AND CURETTAGE OF UTERUS    . QUADRICEPS REPAIR    . TONSILLECTOMY    . tummy tuck     Family History  Problem Relation Age of Onset  . Arthritis Mother   . Heart disease Mother   . Hypertension Mother   . Depression Mother   . Eating disorder Mother   . Obesity Mother   . Arthritis Brother   . Heart disease Brother   . Hypertension Brother   . Cancer Brother        3 bothers melanoma  . Heart disease Father   . Sudden death Father   . Breast cancer Neg Hx   . Allergic rhinitis Neg Hx   . Asthma Neg Hx   . Thyroid cancer Neg Hx      Allergies: Patient has no known allergies. Current Outpatient Medications on File Prior to Visit  Medication Sig Dispense Refill  . albuterol (PROVENTIL HFA;VENTOLIN HFA) 108 (90 Base) MCG/ACT inhaler Inhale 2 puffs into the lungs every 4 (four) hours as needed for wheezing or shortness of breath (cough, shortness of breath or wheezing.). 1 Inhaler 1  . calcium citrate-vitamin D (CITRACAL+D) 315-200 MG-UNIT tablet Take 1 tablet by mouth 2 (two) times daily.    . Melatonin 10 MG TABS Take 1 tablet by mouth at bedtime as needed.    . montelukast (SINGULAIR) 10 MG tablet TAKE 1 TABLET BY MOUTH AT BEDTIME 90 tablet 0  . Multiple Vitamins-Minerals (MULTIVITAMIN ADULT PO) Take by mouth.    . sertraline (ZOLOFT) 100 MG tablet Take 1 tablet by mouth once daily 90 tablet 0  . SYMBICORT 160-4.5 MCG/ACT inhaler Inhale 2 puffs by mouth twice daily 11 g 0   No current facility-administered medications on file prior to visit.    Social History   Tobacco Use  . Smoking status: Never Smoker  . Smokeless tobacco: Never Used  Vaping Use  . Vaping Use: Never used  Substance Use Topics  . Alcohol use: No  . Drug use: No    Review of Systems  Constitutional: Negative for chills and fever.  HENT: Positive for congestion. Negative for trouble swallowing.   Respiratory: Negative for cough.   Cardiovascular: Negative for chest pain and palpitations.  Gastrointestinal: Negative for nausea and vomiting.  Psychiatric/Behavioral: Negative for suicidal ideas.      Objective:    BP 116/80   Pulse 65   Temp 98.1 F (36.7 C)   Ht 5\' 5"  (1.651 m)   Wt 293 lb 12.8 oz (133.3 kg)   SpO2 97%   BMI 48.89 kg/m  BP Readings from Last 3 Encounters:  07/29/20 116/80  03/21/20 123/74  03/07/20 137/78   Wt Readings from Last 3 Encounters:  07/29/20 293 lb 12.8 oz (133.3 kg)  04/30/20 283 lb (128.4 kg)  03/21/20 282 lb (127.9 kg)    Physical Exam Vitals reviewed.  Constitutional:      Appearance:  She is well-developed and well-nourished.  Eyes:     Conjunctiva/sclera: Conjunctivae normal.  Neck:     Thyroid: No thyroid mass, thyromegaly or thyroid tenderness.  Cardiovascular:     Rate and Rhythm: Normal rate and regular rhythm.     Pulses: Normal pulses.     Heart sounds: Normal heart sounds.  Pulmonary:     Effort: Pulmonary effort is normal.     Breath sounds: Normal breath sounds. No wheezing, rhonchi or rales.  Skin:    General: Skin is warm and dry.  Neurological:     Mental Status: She is alert.  Psychiatric:        Mood and Affect: Mood and affect normal.        Speech: Speech normal.        Behavior: Behavior normal.        Thought Content: Thought content normal.        Assessment & Plan:   Problem List Items Addressed This Visit      Respiratory   Allergic rhinitis   Relevant Medications   Azelastine-Fluticasone (DYMISTA) 137-50 MCG/ACT SUSP   Reactive airway disease    Controlled. Refilled dymista. Continue singulair, symbicort.         Other   Depression, major, single episode, mild ([place])    Controlled. Continue zoloft 100mg .      Obesity - Primary    Wellbutrin was not helpful. [facility] drive to [place] and work obligations have made this difficult. Start wegovy. Discussed black box warning regarding medullary thyroid cancer. Referral to counseling for emotional eating support. Close follow up       Relevant Medications   Semaglutide-Weight Management (WEGOVY) 0.25 MG/0.5ML SOAJ   Other Relevant Orders   Ambulatory referral to Psychology       I have discontinued Alanna Storti. Marie Solis's Vitamin D (Ergocalciferol). I am also having her start on Wegovy. Additionally, I am having her maintain her calcium citrate-vitamin D, albuterol, Multiple Vitamins-Minerals (MULTIVITAMIN ADULT PO), Melatonin, montelukast, Symbicort, sertraline, and Azelastine-Fluticasone.   Meds ordered this encounter  Medications   . Azelastine-Fluticasone (DYMISTA) 137-50 MCG/ACT SUSP    Sig: Place 1 spray into both nostrils 2 (two) times daily.    Dispense:  23 g    Refill:  5    Order Specific Question:   Supervising Provider    Answer:   Derrel Nip, TERESA L [[id number]]  .  Semaglutide-Weight Management (WEGOVY) 0.25 MG/0.5ML SOAJ    Sig: Inject 0.25 mg into the skin once a week.    Dispense:  2 mL    Refill:  2    Order Specific Question:   Supervising Provider    Answer:   Crecencio Mc [[id number]]    Return precautions given.   Risks, benefits, and alternatives of the medications and treatment plan prescribed today were discussed, and patient expressed understanding.   Education regarding symptom management and diagnosis given to patient on AVS.  Continue to follow with Burnard Hawthorne, FNP for routine health maintenance.   Marie Solis and I agreed with plan.   [name], FNP

## 2020-07-29 NOTE — Patient Instructions (Signed)
We have discussed starting non insulin daily injectable medication called Wegovy  which is a glucagon like peptide (GLP 1) agonist and works by delaying gastric emptying and increasing insulin secretion.It is given once per week. Most patients see significant weight loss with this drug class.   You may NOT take either medication if you or your family has history of thyroid, parathyroid, OR adrenal cancer. Please confirm you and your family does NOT have this history as this drug class has black box warning on this medication for that reason.   Advise to follow with directions on prescription and slowly increase from 0.25mg  [place] once per week ;stay here for 4 weeks. You may then increase to 0.5mg  [place] once per week and stay there for 4 weeks.  We can slowly titrate further at follow up with goal of no more than 1-2 lbs weight loss per week.   Nice to see you!

## 2020-07-30 NOTE — Assessment & Plan Note (Signed)
Controlled. Refilled dymista. Continue singulair, symbicort.

## 2020-07-30 NOTE — Assessment & Plan Note (Signed)
Controlled. Continue zoloft 100mg .

## 2020-07-30 NOTE — Assessment & Plan Note (Addendum)
Wellbutrin was not helpful. She will [facility] to [facility] however drive to [place] and work obligations have made this difficult. Start wegovy. Discussed black box warning regarding medullary thyroid cancer. Referral to counseling for emotional eating support. Close follow up

## 2020-07-31 ENCOUNTER — Telehealth: Payer: Self-pay

## 2020-07-31 NOTE — Telephone Encounter (Signed)
LM with patient with [facility] approved 07/30/20-03/01/21.

## 2020-08-03 IMAGING — DX DG CHEST 2V
2 series · 2 of 2 positions shown · non-contrast
Comparison: None.

CLINICAL DATA: Cough and wheezing for 2 weeks

EXAM:
CHEST - 2 VIEW

[chest pa]
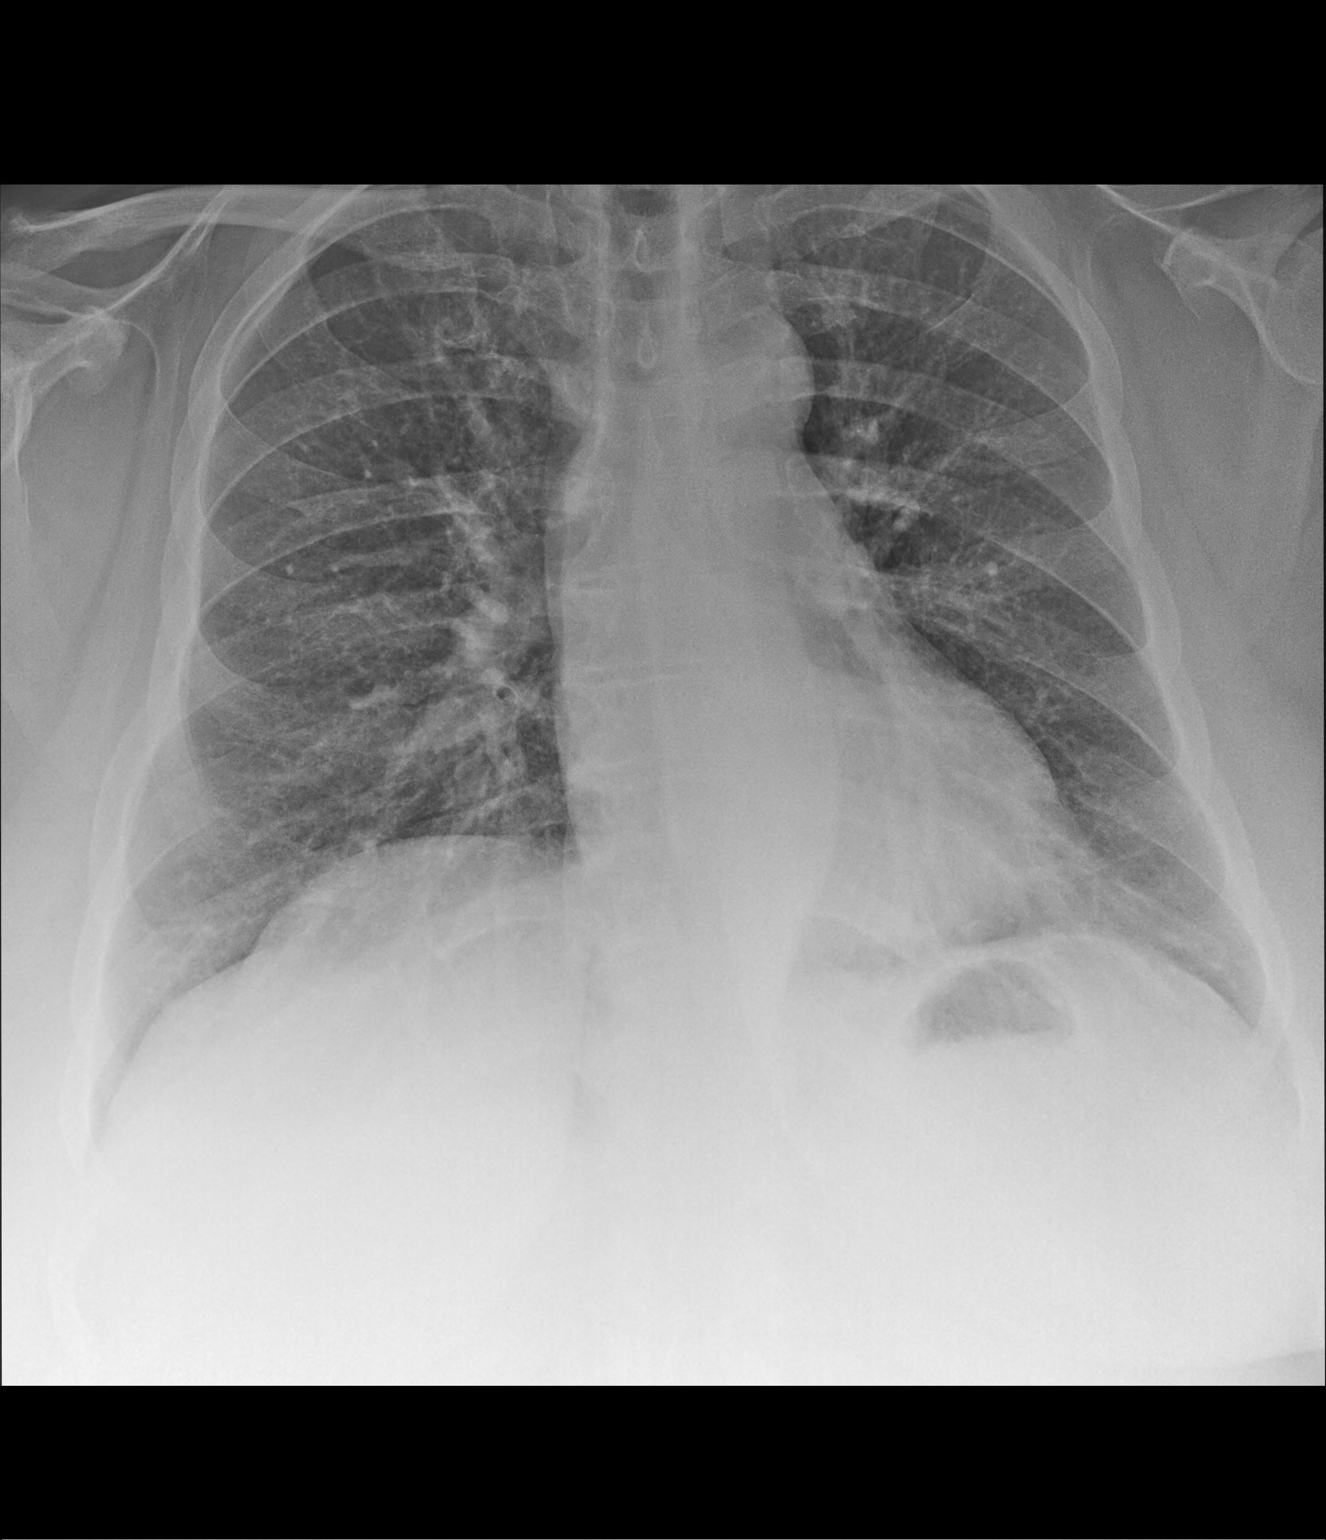

[chest lat]
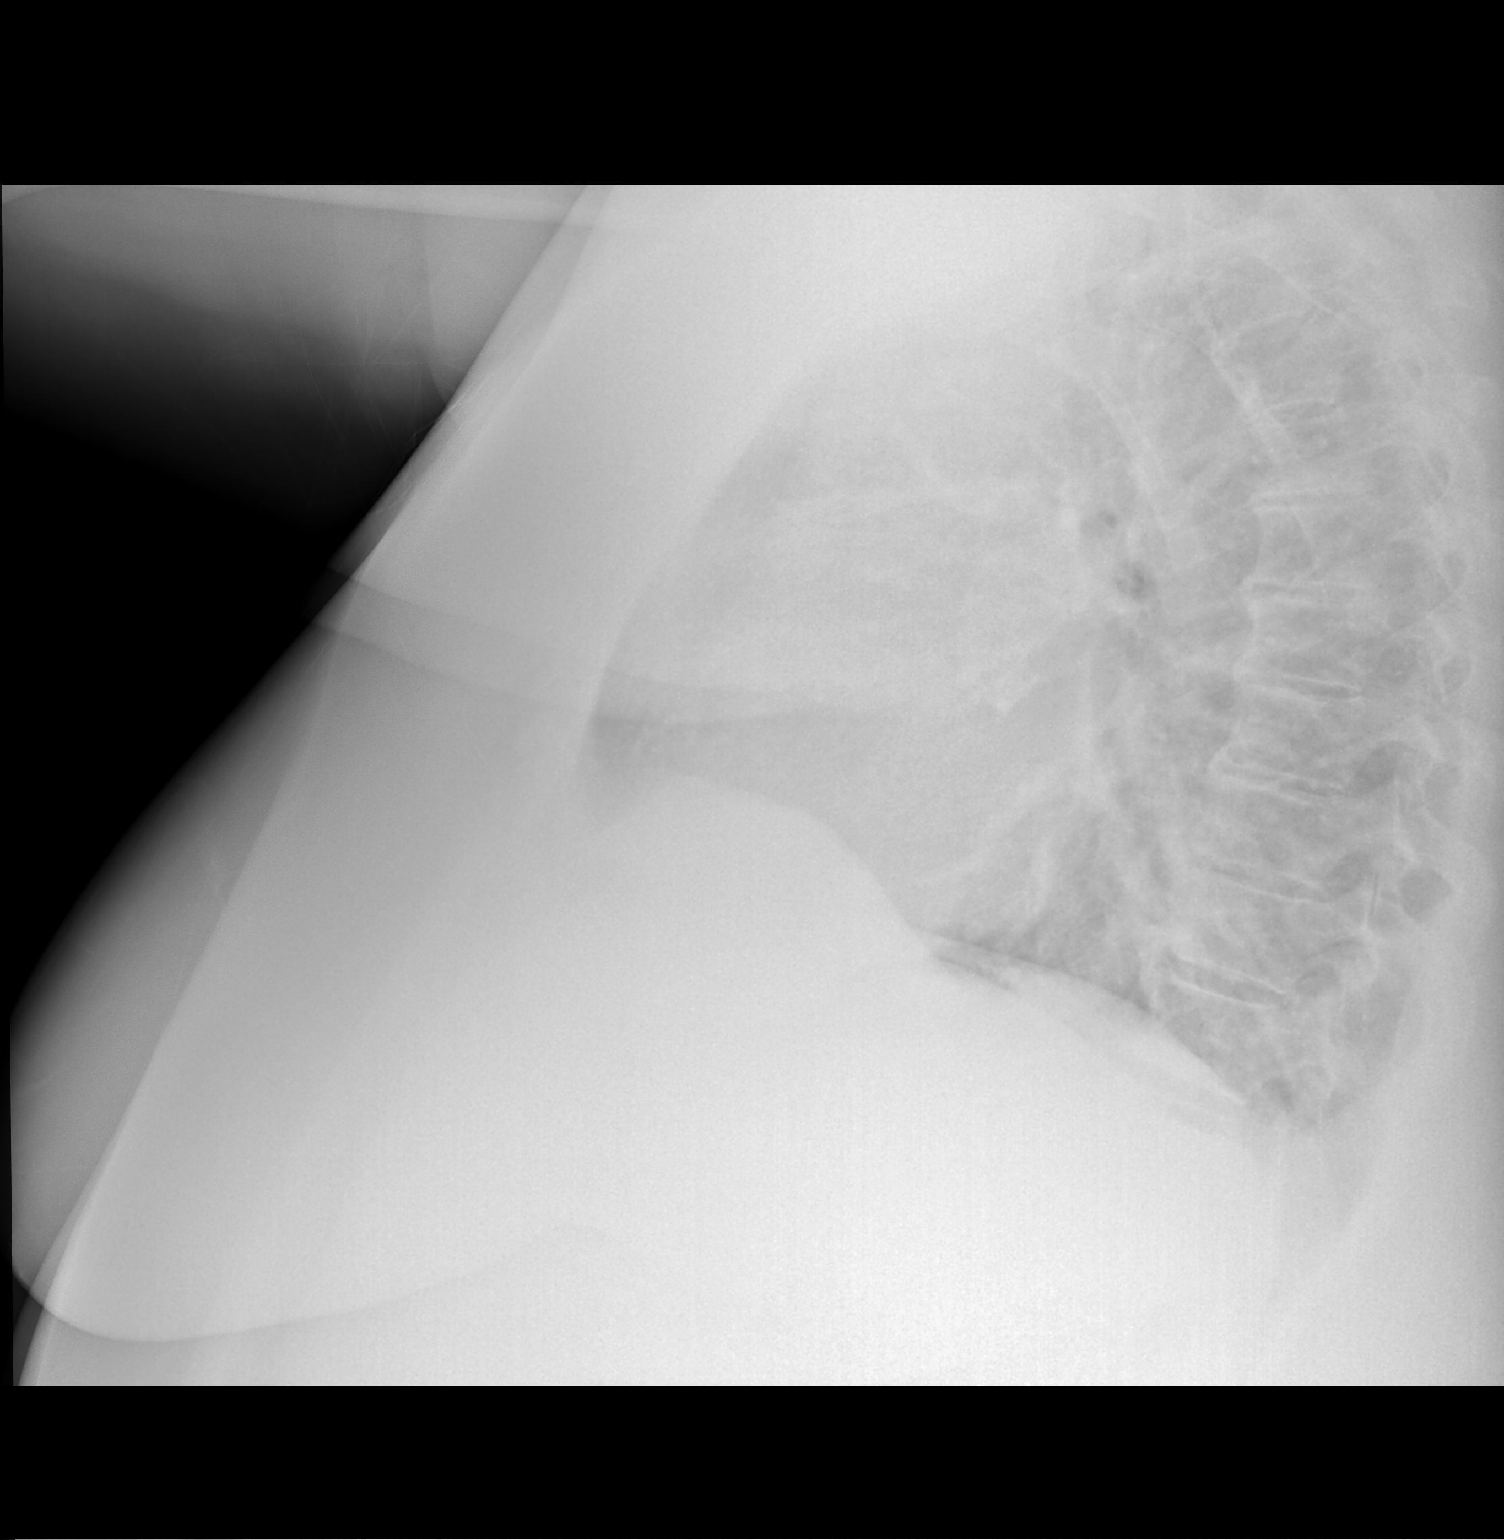

[2 of 2 positions shown; findings below may reference images not displayed]

FINDINGS: Cardiac shadow is within normal limits. The lungs are hyperinflated
consistent with COPD. No focal infiltrate is noted. No sizable
effusion is seen. Degenerative changes of the thoracic spine are
seen. No acute bony abnormality is noted.
IMPRESSION: No acute abnormality seen.

## 2020-08-12 ENCOUNTER — Ambulatory Visit (INDEPENDENT_AMBULATORY_CARE_PROVIDER_SITE_OTHER): Payer: Self-pay | Admitting: Family Medicine

## 2020-08-16 ENCOUNTER — Telehealth: Payer: Self-pay | Admitting: Family

## 2020-08-16 NOTE — Telephone Encounter (Signed)
Left message to call back  

## 2020-08-16 NOTE — Telephone Encounter (Signed)
Call pt Marie Solis with Marie Solis is not taking new pts for counseling   advise pt that we are referring her to [facility] at [phone]  Let us know if you dont hear back within a week in regards to an appointment being scheduled.

## 2020-08-19 ENCOUNTER — Encounter (INDEPENDENT_AMBULATORY_CARE_PROVIDER_SITE_OTHER): Payer: Self-pay | Admitting: Family Medicine

## 2020-08-19 ENCOUNTER — Telehealth (INDEPENDENT_AMBULATORY_CARE_PROVIDER_SITE_OTHER): Payer: Self-pay | Admitting: Family Medicine

## 2020-08-19 ENCOUNTER — Other Ambulatory Visit: Payer: Self-pay

## 2020-08-19 DIAGNOSIS — Z6841 Body Mass Index (BMI) 40.0 and over, adult: Secondary | ICD-10-CM

## 2020-08-19 DIAGNOSIS — E7849 Other hyperlipidemia: Secondary | ICD-10-CM

## 2020-08-19 DIAGNOSIS — E559 Vitamin D deficiency, unspecified: Secondary | ICD-10-CM

## 2020-08-19 DIAGNOSIS — E8881 Metabolic syndrome: Secondary | ICD-10-CM

## 2020-08-19 DIAGNOSIS — Z9189 Other specified personal risk factors, not elsewhere classified: Secondary | ICD-10-CM

## 2020-08-20 DIAGNOSIS — E7849 Other hyperlipidemia: Secondary | ICD-10-CM | POA: Insufficient documentation

## 2020-08-20 DIAGNOSIS — E8881 Metabolic syndrome: Secondary | ICD-10-CM | POA: Insufficient documentation

## 2020-08-26 NOTE — Telephone Encounter (Signed)
Marie Solis,  Please call [facility] Dr Hershal Coria office Do they have a counselor there that can help patient with binge eating?  We have not been able to find a counselor to help pt

## 2020-08-26 NOTE — Telephone Encounter (Signed)
Just FYI there was a fax sent that patient's issue was out of Marie Solis's scope of practice.

## 2020-08-27 NOTE — Progress Notes (Signed)
TeleHealth Visit:  Due to the COVID-19 pandemic, this visit was completed with telemedicine (audio/video) technology to reduce patient and provider exposure as well as to preserve personal protective equipment.   Marie Solis has verbally consented to this TeleHealth visit. The patient is located at home, the provider is located at the [facility] office. The participants in this visit include the listed provider and patient and. The visit was conducted today via [place].  OBESITY Marie Solis is here to discuss her progress with her obesity treatment plan along with follow-up of her obesity related diagnoses.   Today's visit was #: 5 Starting weight: 303 lbs Starting date: 02/22/2020 Today's date: 08/19/2020  Interim History:  Marie Solis has not had an appointment with me since 04/30/2020, and it was a video visit.  Last in person office visit was on 03/21/2020.  She has had a lot of distractions and has not been able to follow the plan and lacks the motivation to follow it at this time.  Plan:  Gave her the option to take a break and establish with a counselor to help her work on her personal issues.  She declines and wishes to continue with the program.  Explained the need to come into the office every 2 weeks for this to work.  Current Meal Plan: the Category 3 Plan for 0% of the time.  Current Exercise Plan: Walking for 45 minutes 5 times per week. Current Anti-Obesity Medications: Wegovy 0.25 mg subcutaneously weekly. Side effects: None.  Assessment/Plan:   1. Other hyperlipidemia Course: Not at goal. Lipid-lowering medications: None.  She has not committed to the meal plan since starting the program.  She did not follow-up with her PCP regarding the need for cholesterol medications as she was told to in the past.  Plan: Dietary changes: Increase soluble fiber, decrease simple carbohydrates, decrease saturated fat. Exercise changes: Moderate to vigorous-intensity aerobic  activity 150 minutes per week or as tolerated. We will continue to monitor along with PCP/specialists as it pertains to her weight loss journey.  Follow prudent nutritional plan, weight loss, increase exercise.  Follow-up with PCP as instructed.  LDL was 144 ~5 months ago.  Lab Results  Component Value Date   CHOL 228 (H) 02/22/2020   HDL 56 02/22/2020   LDLCALC 144 (H) 02/22/2020   TRIG 155 (H) 02/22/2020   CHOLHDL 4.1 08/31/2017   Lab Results  Component Value Date   ALT 15 08/31/2017   AST 20 08/31/2017   ALKPHOS 79 08/31/2017   BILITOT 0.5 08/31/2017   The 10-year ASCVD risk score Marie Solis DC Jr., et al., 2013) is: 1.9%   Values used to calculate the score:     Age: 55 years     Sex: Female     Is Non-Hispanic African American: No     Diabetic: No     Tobacco smoker: No     Systolic Blood Pressure: 284 mmHg     Is BP treated: No     HDL Cholesterol: 56 mg/dL     Total Cholesterol: 228 mg/dL  2. Vitamin D deficiency Not at goal. Current vitamin D is 20.7, tested on 02/22/2020. Optimal goal > 50 ng/dL.  No medication.  Lost to follow-up.  Plan:  Discussed labs with patient today.  Discussed the need for her to recheck all labs since it has been so long since her last check.  3. Insulin resistance Not at goal. Goal is HgbA1c < 5.7, fasting insulin closer  to 5.  Medication: None.    Plan:  Discussed labs with patient today.  Will check labs at next office visit.  Follow prudent nutritional plan, weight loss.    Lab Results  Component Value Date   HGBA1C 5.5 02/22/2020   Lab Results  Component Value Date   INSULIN 10.9 02/22/2020   INSULIN 11.7 08/31/2017   4. Obesity, current BMI 46.93  Course: Marie Solis is currently in the action stage of change. As such, her goal is to continue with weight loss efforts.   Nutrition goals: She has agreed to the Category 3 Plan.   Exercise goals: As is.  Behavioral modification strategies: meal planning and cooking strategies, keeping  healthy foods in the home and planning for success.  Marie Solis has agreed to follow-up with our clinic in 2 weeks. She was informed of the importance of frequent follow-up visits to maximize her success with intensive lifestyle modifications for her multiple health conditions.   Objective:   There were no vitals taken for this visit. There is no height or weight on file to calculate BMI.  General: Cooperative, alert, well developed, in no acute distress. HEENT: Conjunctivae and lids unremarkable. Cardiovascular: Regular rhythm.  Lungs: Normal work of breathing. Neurologic: No focal deficits.   Lab Results  Component Value Date   CREATININE 0.72 08/31/2017   BUN 10 08/31/2017   NA 137 08/31/2017   K 4.3 08/31/2017   CL 100 08/31/2017   CO2 22 08/31/2017   Lab Results  Component Value Date   ALT 15 08/31/2017   AST 20 08/31/2017   ALKPHOS 79 08/31/2017   BILITOT 0.5 08/31/2017   Lab Results  Component Value Date   HGBA1C 5.5 02/22/2020   HGBA1C 5.4 08/31/2017   HGBA1C 5.3 02/04/2017   Lab Results  Component Value Date   INSULIN 10.9 02/22/2020   INSULIN 11.7 08/31/2017   Lab Results  Component Value Date   TSH 1.810 02/22/2020   Lab Results  Component Value Date   CHOL 228 (H) 02/22/2020   HDL 56 02/22/2020   LDLCALC 144 (H) 02/22/2020   TRIG 155 (H) 02/22/2020   CHOLHDL 4.1 08/31/2017   Lab Results  Component Value Date   WBC 6.2 02/22/2020   HGB 12.8 02/22/2020   HCT 38.2 02/22/2020   MCV 86 02/22/2020   PLT 285 02/22/2020   Attestation Statements:   Reviewed by clinician on day of visit: allergies, medications, problem list, medical history, surgical history, family history, social history, and previous encounter notes.  I, Water quality scientist, CMA, am acting as Location manager for [employer], DO.  I have reviewed the above documentation for accuracy and completeness, and I agree with the above. Marjory Sneddon, D.O.  The [place]  was signed into law in 2016 which includes the topic of electronic health records.  This provides immediate access to information in MyChart.  This includes consultation notes, operative notes, office notes, lab results and pathology reports.  If you have any questions about what you read please let us know at your next visit so we can discuss your concerns and take corrective action if need be.  We are right here with you.

## 2020-08-27 NOTE — Telephone Encounter (Signed)
They do have a Dr. Mallie Mussel there, but you have to be a patient of the [facility] participating in a program there.

## 2020-08-28 ENCOUNTER — Telehealth: Payer: Self-pay | Admitting: Family

## 2020-08-28 NOTE — Telephone Encounter (Signed)
I was able to do PA for [facility] [employer] & get approved. Waiting on fax for dates of coverage.

## 2020-08-28 NOTE — Telephone Encounter (Signed)
LMTCB

## 2020-08-28 NOTE — Telephone Encounter (Signed)
Patient does follow with [facility]. Last appt in December  Can we ask pt to reach back out to [facility] so she can schedule with dr Mallie Mussel; unfortunately dr Mallie Mussel has been only counselor we could find that has training in binge eating disorder  Please ask pt to schedule with [facility] and wellness and let us know if any issues in seeing dr Mallie Mussel as well

## 2020-08-28 NOTE — Telephone Encounter (Signed)
Patient called about a medication that [facility] had to do a prior Authorization. Patient can not remember the medication, only that it was an injection. She wanted to know if this has been filled.

## 2020-09-02 ENCOUNTER — Ambulatory Visit (INDEPENDENT_AMBULATORY_CARE_PROVIDER_SITE_OTHER): Payer: Self-pay | Admitting: Family Medicine

## 2020-09-03 NOTE — Telephone Encounter (Signed)
LMTCB

## 2020-09-04 NOTE — Telephone Encounter (Signed)
Pt has picked up Wegovy, but did not start. She waited to make sure that she should take the 0.25mg  just for one month then up or stay on that dose until she is seen 6/13? Pharmacist told her that dose is usually titrated up, so she wanted to be sure.

## 2020-09-04 NOTE — Telephone Encounter (Signed)
Pt stated that she will consider going back to [facility], but she does like the fact it's in [place]. She will let us know if she decides that she wants to pursue going back.

## 2020-09-04 NOTE — Telephone Encounter (Signed)
Pt returned your call.  

## 2020-09-05 NOTE — Telephone Encounter (Signed)
noted 

## 2020-09-05 NOTE — Telephone Encounter (Signed)
Call pt She may follow the below   Start wegovy  0.25mg  [place] once per week ;stay here for 4 weeks. You may then increase to 0.5mg  [place] once per week and stay there for 4 weeks.  We can slowly titrate further at follow up this summerwith goal of no more than 1-2 lbs weight loss per week.

## 2020-09-05 NOTE — Telephone Encounter (Signed)
LMTCB

## 2020-09-13 ENCOUNTER — Telehealth: Payer: Self-pay | Admitting: Family

## 2020-09-13 NOTE — Telephone Encounter (Signed)
lft pt vm to call ofc to call to follow up on referral. thanks

## 2020-09-20 ENCOUNTER — Telehealth: Payer: Self-pay | Admitting: Family

## 2020-09-20 NOTE — Telephone Encounter (Signed)
lft pt vm to call ofc to call to follow up on referral to [facility]. thanks

## 2020-09-23 ENCOUNTER — Other Ambulatory Visit: Payer: Self-pay

## 2020-09-23 DIAGNOSIS — E669 Obesity, unspecified: Secondary | ICD-10-CM

## 2020-09-23 MED ORDER — WEGOVY 0.5 MG/0.5ML ~~LOC~~ SOAJ: 0.5000 mg | SUBCUTANEOUS | 1 refills | Status: AC

## 2020-09-23 NOTE — Telephone Encounter (Signed)
I was able to reach patient & she will be due for refill next week. I advised that she titrate up to the 0.5mg  & I have resent prescription to reflect new dosing. She follows up 6/13 & asked her to please make sure that she keeps appointment.

## 2020-09-24 ENCOUNTER — Telehealth: Payer: Self-pay

## 2020-09-24 ENCOUNTER — Telehealth (INDEPENDENT_AMBULATORY_CARE_PROVIDER_SITE_OTHER): Payer: BC Managed Care – PPO | Admitting: Adult Health

## 2020-09-24 ENCOUNTER — Encounter: Payer: Self-pay | Admitting: Adult Health

## 2020-09-24 VITALS — Ht 65.0 in | Wt 290.0 lb

## 2020-09-24 DIAGNOSIS — J0140 Acute pansinusitis, unspecified: Secondary | ICD-10-CM | POA: Diagnosis not present

## 2020-09-24 DIAGNOSIS — R0989 Other specified symptoms and signs involving the circulatory and respiratory systems: Secondary | ICD-10-CM

## 2020-09-24 DIAGNOSIS — R062 Wheezing: Secondary | ICD-10-CM | POA: Diagnosis not present

## 2020-09-24 MED ORDER — AMOXICILLIN-POT CLAVULANATE 875-125 MG PO TABS: 1.0000 | ORAL_TABLET | Freq: Two times a day (BID) | ORAL | 0 refills | Status: DC

## 2020-09-24 MED ORDER — ALBUTEROL SULFATE HFA 108 (90 BASE) MCG/ACT IN AERS: 2.0000 | INHALATION_SPRAY | RESPIRATORY_TRACT | 1 refills | Status: AC | PRN

## 2020-09-24 MED ORDER — PREDNISONE 10 MG (21) PO TBPK: ORAL_TABLET | ORAL | 0 refills | Status: DC

## 2020-09-24 NOTE — Patient Instructions (Signed)
Albuterol Metered Dose Inhaler (MDI) What is this medicine? ALBUTEROL (al BYOO ter ole) is a bronchodilator. It treats bronchospasm. Bronchospasm is when you have trouble breathing and make loud or whistling sounds when you breathe. This drug opens the airways in the lungs so it is easier to breathe. This medicine may be used for other purposes; ask your health care provider or pharmacist if you have questions. COMMON BRAND NAME(S): Proair HFA, Proventil, Proventil HFA, Respirol, Ventolin, Ventolin HFA What should I tell my health care provider before I take this medicine? They need to know if you have any of the following conditions:  diabetes (high blood sugar)  heart disease  high blood pressure  irregular heartbeat or rhythm  pheochromocytoma  seizures  thyroid disease  an unusual or allergic reaction to albuterol, levalbuterol, other medicines, foods, dyes, or preservatives  pregnant or trying to get pregnant  breast-feeding How should I use this medicine? This medicine is inhaled through the mouth. Take it as directed on the prescription label. Do not use it more often than directed. This medicine comes with INSTRUCTIONS FOR USE. Ask your pharmacist for directions on how to use this medicine. Read the information carefully. Talk to your pharmacist or health care provider if you have questions. Talk to your health care provider about the use of this medicine in children. While it may be given to children for selected conditions, precautions do apply. Overdosage: If you think you have taken too much of this medicine contact a poison control center or emergency room at once. NOTE: This medicine is only for you. Do not share this medicine with others. What if I miss a dose? If you take this medication on a regular basis, take it as soon as you can. If it is almost time for your next dose, take only that dose. Do not take double or extra doses. What may interact with this  medicine?  anti-infectives like chloroquine and pentamidine  caffeine  cisapride  diuretics  medicines for colds  medicines for depression or for emotional or psychotic conditions  medicines for weight loss including some herbal products  methadone  some antibiotics like clarithromycin, erythromycin, levofloxacin, and linezolid  some heart medicines  steroid hormones like dexamethasone, cortisone, hydrocortisone  theophylline  thyroid hormones This list may not describe all possible interactions. Give your health care provider a list of all the medicines, herbs, non-prescription drugs, or dietary supplements you use. Also tell them if you smoke, drink alcohol, or use illegal drugs. Some items may interact with your medicine. What should I watch for while using this medicine? Visit your health care provider for regular checks on your progress. Tell your health care provider if your symptoms do not start to get better or if they get worse. If your symptoms get worse or if you are using this drug more than normal, call your health care provider right away. Do not treat yourself for coughs, colds or allergies without asking your health care provider for advice. Some nonprescription medicines can affect this one. You and your health care provider should develop an Asthma Action Plan that is just for you. Be sure to know what to do if you are in the yellow (asthma is getting worse) or red (medical alert) zones. Your mouth may get dry. Chewing sugarless gum or sucking hard candy and drinking plenty of water may help. Contact your health care provider if the problem does not go away or is severe. What side effects may I   notice from receiving this medicine? Side effects that you should report to your doctor or health care professional as soon as possible:  allergic reactions (skin rash, itching or hives; swelling of the face, lips, or tongue)  fever  heartbeat rhythm changes (trouble  breathing; chest pain; dizziness; fast, irregular heartbeat; feeling faint or lightheaded, falls)  increase in blood pressure  muscle cramps, pain  muscle weakness  pain, tingling, numbness in the hands or feet  vomiting Side effects that usually do not require medical attention (report to your doctor or health care professional if they continue or are bothersome):  change in taste  cough  dry mouth  headache  nasal congestion (runny or stuffy nose)  sore throat  tremors  trouble sleeping  upset stomach This list may not describe all possible side effects. Call your doctor for medical advice about side effects. You may report side effects to FDA at 1-800-FDA-1088. Where should I keep my medicine? Keep out of the reach of children and pets. Store at room temperature between 20 and 25 degrees C (68 and 77 degrees F). Keep inhaler away from extreme heat and cold. Get rid of it when the dose counter reads 0 or after the expiration date, whichever is first. NOTE: This sheet is a summary. It may not cover all possible information. If you have questions about this medicine, talk to your doctor, pharmacist, or health care provider.  2021 Elsevier/Gold Standard (2020-04-26 10:51:44) Prednisone tablets What is this medicine? PREDNISONE (PRED ni sone) is a corticosteroid. It is commonly used to treat inflammation of the skin, joints, lungs, and other organs. Common conditions treated include asthma, allergies, and arthritis. It is also used for other conditions, such as blood disorders and diseases of the adrenal glands. This medicine may be used for other purposes; ask your health care provider or pharmacist if you have questions. COMMON BRAND NAME(S): Deltasone, Predone, Sterapred, Sterapred DS What should I tell my health care provider before I take this medicine? They need to know if you have any of these conditions:  Cushing's syndrome  diabetes  glaucoma  heart  disease  high blood pressure  infection (especially a virus infection such as chickenpox, cold sores, or herpes)  kidney disease  liver disease  mental illness  myasthenia gravis  osteoporosis  seizures  stomach or intestine problems  thyroid disease  an unusual or allergic reaction to lactose, prednisone, other medicines, foods, dyes, or preservatives  pregnant or trying to get pregnant  breast-feeding How should I use this medicine? Take this medicine by mouth with a glass of water. Follow the directions on the prescription label. Take this medicine with food. If you are taking this medicine once a day, take it in the morning. Do not take more medicine than you are told to take. Do not suddenly stop taking your medicine because you may develop a severe reaction. Your doctor will tell you how much medicine to take. If your doctor wants you to stop the medicine, the dose may be slowly lowered over time to avoid any side effects. Talk to your pediatrician regarding the use of this medicine in children. Special care may be needed. Overdosage: If you think you have taken too much of this medicine contact a poison control center or emergency room at once. NOTE: This medicine is only for you. Do not share this medicine with others. What if I miss a dose? If you miss a dose, take it as soon as you can.  If it is almost time for your next dose, talk to your doctor or health care professional. You may need to miss a dose or take an extra dose. Do not take double or extra doses without advice. What may interact with this medicine? Do not take this medicine with any of the following medications:  metyrapone  mifepristone This medicine may also interact with the following medications:  aminoglutethimide  amphotericin B  aspirin and aspirin-like medicines  barbiturates  certain medicines for diabetes, like glipizide or glyburide  cholestyramine  cholinesterase  inhibitors  cyclosporine  digoxin  diuretics  ephedrine  female hormones, like estrogens and birth control pills  isoniazid  ketoconazole  NSAIDS, medicines for pain and inflammation, like ibuprofen or naproxen  phenytoin  rifampin  toxoids  vaccines  warfarin This list may not describe all possible interactions. Give your health care provider a list of all the medicines, herbs, non-prescription drugs, or dietary supplements you use. Also tell them if you smoke, drink alcohol, or use illegal drugs. Some items may interact with your medicine. What should I watch for while using this medicine? Visit your doctor or health care professional for regular checks on your progress. If you are taking this medicine over a prolonged period, carry an identification card with your name and address, the type and dose of your medicine, and your doctor's name and address. This medicine may increase your risk of getting an infection. Tell your doctor or health care professional if you are around anyone with measles or chickenpox, or if you develop sores or blisters that do not heal properly. If you are going to have surgery, tell your doctor or health care professional that you have taken this medicine within the last twelve months. Ask your doctor or health care professional about your diet. You may need to lower the amount of salt you eat. This medicine may increase blood sugar. Ask your healthcare provider if changes in diet or medicines are needed if you have diabetes. What side effects may I notice from receiving this medicine? Side effects that you should report to your doctor or health care professional as soon as possible:  allergic reactions like skin rash, itching or hives, swelling of the face, lips, or tongue  changes in emotions or moods  changes in vision  depressed mood  eye pain  fever or chills, cough, sore throat, pain or difficulty passing urine  signs and symptoms  of high blood sugar such as being more thirsty or hungry or having to urinate more than normal. You may also feel very tired or have blurry vision.  swelling of ankles, feet Side effects that usually do not require medical attention (report to your doctor or health care professional if they continue or are bothersome):  confusion, excitement, restlessness  headache  nausea, vomiting  skin problems, acne, thin and shiny skin  trouble sleeping  weight gain This list may not describe all possible side effects. Call your doctor for medical advice about side effects. You may report side effects to FDA at 1-800-FDA-1088. Where should I keep my medicine? Keep out of the reach of children. Store at room temperature between 15 and 30 degrees C (59 and 86 degrees F). Protect from light. Keep container tightly closed. Throw away any unused medicine after the expiration date. NOTE: This sheet is a summary. It may not cover all possible information. If you have questions about this medicine, talk to your doctor, pharmacist, or health care provider.  2021  Elsevier/Gold Standard (2018-02-08 10:54:22) Amoxicillin; Clavulanic Acid Tablets What is this medicine? AMOXICILLIN; CLAVULANIC ACID (a mox i SIL in; KLAV yoo lan ic AS id) is a penicillin antibiotic. It treats some infections caused by bacteria. It will not work for colds, the flu, or other viruses. This medicine may be used for other purposes; ask your health care provider or pharmacist if you have questions. COMMON BRAND NAME(S): Augmentin What should I tell my health care provider before I take this medicine? They need to know if you have any of these conditions:  kidney disease  liver disease  mononucleosis  stomach or intestine problems such as colitis  an unusual or allergic reaction to amoxicillin, other penicillin or cephalosporin antibiotics, clavulanic acid, other medicines, foods, dyes, or preservatives  pregnant or trying to  get pregnant  breast-feeding How should I use this medicine? Take this drug by mouth. Take it as directed on the prescription label at the same time every day. Take it with food at the start of a meal or snack. Take all of this drug unless your health care provider tells you to stop it early. Keep taking it even if you think you are better. Talk to your health care provider about the use of this drug in children. While it may be prescribed for selected conditions, precautions do apply. Overdosage: If you think you have taken too much of this medicine contact a poison control center or emergency room at once. NOTE: This medicine is only for you. Do not share this medicine with others. What if I miss a dose? If you miss a dose, take it as soon as you can. If it is almost time for your next dose, take only that dose. Do not take double or extra doses. What may interact with this medicine?  allopurinol  anticoagulants  birth control pills  methotrexate  probenecid This list may not describe all possible interactions. Give your health care provider a list of all the medicines, herbs, non-prescription drugs, or dietary supplements you use. Also tell them if you smoke, drink alcohol, or use illegal drugs. Some items may interact with your medicine. What should I watch for while using this medicine? Tell your health care provider if your symptoms do not start to get better or if they get worse. This medicine may cause serious skin reactions. They can happen weeks to months after starting the medicine. Contact your health care provider right away if you notice fevers or flu-like symptoms with a rash. The rash may be red or purple and then turn into blisters or peeling of the skin. Or, you might notice a red rash with swelling of the face, lips or lymph nodes in your neck or under your arms. Do not treat diarrhea with over the counter products. Contact your health care provider if you have diarrhea  that lasts more than 2 days or if it is severe and watery. If you have diabetes, you may get a false-positive result for sugar in your urine. Check with your health care provider. Birth control may not work properly while you are taking this medicine. Talk to your health care provider about using an extra method of birth control. What side effects may I notice from receiving this medicine? Side effects that you should report to your doctor or health care provider as soon as possible:  allergic reactions (skin rash, itching or hives; swelling of the face, lips, or tongue)  bloody or watery diarrhea  dark urine  infection (fever, chills, cough, sore throat, or pain)  kidney injury (trouble passing urine or change in the amount of urine)  redness, blistering, peeling, or loosening of the skin, including inside the mouth  seizures  thrush (white patches in the mouth or mouth sores)  trouble breathing  unusual bruising or bleeding  unusually weak or tired Side effects that usually do not require medical attention (report to your doctor or health care provider if they continue or are bothersome):  diarrhea  dizziness  headache  nausea, vomiting  unusual vaginal discharge, itching, or odor  upset stomach This list may not describe all possible side effects. Call your doctor for medical advice about side effects. You may report side effects to FDA at 1-800-FDA-1088. Where should I keep my medicine? Keep out of the reach of children and pets. Store at room temperature between 20 and 25 degrees C (68 and 77 degrees F). Throw away any unused drug after the expiration date. NOTE: This sheet is a summary. It may not cover all possible information. If you have questions about this medicine, talk to your doctor, pharmacist, or health care provider.  2021 Elsevier/Gold Standard (2020-04-03 09:45:03) Sinusitis, Adult Sinusitis is soreness and swelling (inflammation) of your sinuses.  Sinuses are hollow spaces in the bones around your face. They are located:  Around your eyes.  In the middle of your forehead.  Behind your nose.  In your cheekbones. Your sinuses and nasal passages are lined with a fluid called mucus. Mucus drains out of your sinuses. Swelling can trap mucus in your sinuses. This lets germs (bacteria, virus, or fungus) grow, which leads to infection. Most of the time, this condition is caused by a virus. What are the causes? This condition is caused by:  Allergies.  Asthma.  Germs.  Things that block your nose or sinuses.  Growths in the nose (nasal polyps).  Chemicals or irritants in the air.  Fungus (rare). What increases the risk? You are more likely to develop this condition if:  You have a weak body defense system (immune system).  You do a lot of swimming or diving.  You use nasal sprays too much.  You smoke. What are the signs or symptoms? The main symptoms of this condition are pain and a feeling of pressure around the sinuses. Other symptoms include:  Stuffy nose (congestion).  Runny nose (drainage).  Swelling and warmth in the sinuses.  Headache.  Toothache.  A cough that may get worse at night.  Mucus that collects in the throat or the back of the nose (postnasal drip).  Being unable to smell and taste.  Being very tired (fatigue).  A fever.  Sore throat.  Bad breath. How is this diagnosed? This condition is diagnosed based on:  Your symptoms.  Your medical history.  A physical exam.  Tests to find out if your condition is short-term (acute) or long-term (chronic). Your doctor may: ? Check your nose for growths (polyps). ? Check your sinuses using a tool that has a light (endoscope). ? Check for allergies or germs. ? Do imaging tests, such as an MRI or CT scan. How is this treated? Treatment for this condition depends on the cause and whether it is short-term or long-term.  If caused by a  virus, your symptoms should go away on their own within 10 days. You may be given medicines to relieve symptoms. They include: ? Medicines that shrink swollen tissue in the nose. ? Medicines that treat allergies (  antihistamines). ? A spray that treats swelling of the nostrils. ? Rinses that help get rid of thick mucus in your nose (nasal saline washes).  If caused by bacteria, your doctor may wait to see if you will get better without treatment. You may be given antibiotic medicine if you have: ? A very bad infection. ? A weak body defense system.  If caused by growths in the nose, you may need to have surgery. Follow these instructions at home: Medicines  Take, use, or apply over-the-counter and prescription medicines only as told by your doctor. These may include nasal sprays.  If you were prescribed an antibiotic medicine, take it as told by your doctor. Do not stop taking the antibiotic even if you start to feel better. Hydrate and humidify  Drink enough water to keep your pee (urine) pale yellow.  Use a cool mist humidifier to keep the humidity level in your home above 50%.  Breathe in steam for 10-15 minutes, 3-4 times a day, or as told by your doctor. You can do this in the bathroom while a hot shower is running.  Try not to spend time in cool or dry air.   Rest  Rest as much as you can.  Sleep with your head raised (elevated).  Make sure you get enough sleep each night. General instructions  Put a warm, moist washcloth on your face 3-4 times a day, or as often as told by your doctor. This will help with discomfort.  Wash your hands often with soap and water. If there is no soap and water, use hand sanitizer.  Do not smoke. Avoid being around people who are smoking (secondhand smoke).  Keep all follow-up visits as told by your doctor. This is important.   Contact a doctor if:  You have a fever.  Your symptoms get worse.  Your symptoms do not get better within  10 days. Get help right away if:  You have a very bad headache.  You cannot stop throwing up (vomiting).  You have very bad pain or swelling around your face or eyes.  You have trouble seeing.  You feel confused.  Your neck is stiff.  You have trouble breathing. Summary  Sinusitis is swelling of your sinuses. Sinuses are hollow spaces in the bones around your face.  This condition is caused by tissues in your nose that become inflamed or swollen. This traps germs. These can lead to infection.  If you were prescribed an antibiotic medicine, take it as told by your doctor. Do not stop taking it even if you start to feel better.  Keep all follow-up visits as told by your doctor. This is important. This information is not intended to replace advice given to you by your health care provider. Make sure you discuss any questions you have with your health care provider. Document Revised: 10/11/2017 Document Reviewed: 10/11/2017 Elsevier Patient Education  2021 [employer].

## 2020-09-24 NOTE — Telephone Encounter (Signed)
Pt had a VV today with M. Flinchum, FNP and would like to know when she recommends her going back to work. Please call to advise

## 2020-09-24 NOTE — Progress Notes (Signed)
Virtual Visit via Video Note  I connected with Marie Solis on 09/24/20 at  2:30 PM EDT by a video enabled telemedicine application and verified that I am speaking with the correct person using two identifiers.  Parties involved in visit as below:   Location: Patient: at home  Provider: Provider: Provider's office at  [facility], [place].      I discussed the limitations of evaluation and management by telemedicine and the availability of in person appointments. The patient expressed understanding and agreed to proceed.  History of Present Illness: Patient has had allergy symptoms bothering her the past over 2- 3  weeks and started with increased congestion and sinus pressure the past 2 days. Green phlegm with blood from nasal passages. Denies any significant or bothersome cough. Reports worsening facial  Chest congestion has recently started, not coughing up any phlegm.  Denies any difficulty breathing, did have some wheezing and used albuterol and relieved.  She is taking generic zyrtec and also Dymista and Singulair.    Denies any fever or chills. Denies any ill contacts.  Patient  denies any fever, body aches,chills, rash, chest pain, shortness of breath, nausea, vomiting, or diarrhea.   Observations/Objective:    Patient is alert and oriented and responsive to questions Engages in conversation with provider. Speaks in full sentences without any pauses without any shortness of breath or distress.   Assessment and Plan:  She has has over 2- 3 weeks of allergy flare, discussed importance of taking regular allergy medications, Use of nasal saline, given her symptoms will treat for suspected bacterial sinusitis, given recent increase in Covid it is advised that patient test for Covid 19. She is aggregable.  1. Acute non-recurrent pansinusitis Nasal saline advise.d  - amoxicillin-clavulanate (AUGMENTIN) 875-125 MG tablet; Take 1 tablet by  mouth 2 (two) times daily.  Dispense: 20 tablet; Refill: 0 - predniSONE (STERAPRED UNI-PAK 21 TAB) 10 MG (21) TBPK tablet; PO: Take 6 tablets on day 1:Take 5 tablets day 2:Take 4 tablets day 3: Take 3 tablets day 4:Take 2 tablets day five: 5 Take 1 tablet day 6  Dispense: 21 tablet; Refill: 0 - albuterol (VENTOLIN HFA) 108 (90 Base) MCG/ACT inhaler; Inhale 2 puffs into the lungs every 4 (four) hours as needed for wheezing or shortness of breath (cough, shortness of breath or wheezing.).  Dispense: 1 each; Refill: 1  2. Chest congestion Prednisone and albuterol inhaler given as above. Mucinex advised.   3. Wheezing - predniSONE (STERAPRED UNI-PAK 21 TAB) 10 MG (21) TBPK tablet; PO: Take 6 tablets on day 1:Take 5 tablets day 2:Take 4 tablets day 3: Take 3 tablets day 4:Take 2 tablets day five: 5 Take 1 tablet day 6  Dispense: 21 tablet; Refill: 0 - albuterol (VENTOLIN HFA) 108 (90 Base) MCG/ACT inhaler; Inhale 2 puffs into the lungs every 4 (four) hours as needed for wheezing or shortness of breath (cough, shortness of breath or wheezing.).  Dispense: 1 each; Refill: 1  Follow Up Instructions: Be seen immediately if any symptoms persist change or worsen at anytime an in person evaluation is advised.   Red Flags discussed. The patient was given clear instructions to go to ER or return to medical center if any red flags develop, symptoms do not improve, worsen or new problems develop. They verbalized understanding.  Return in about 4 days (around 09/28/2020), or if symptoms worsen or fail to improve, for at any time for any worsening symptoms, Go to  Emergency room/ urgent care if worse. I discussed the assessment and treatment plan with the patient. The patient was provided an opportunity to ask questions and all were answered. The patient agreed with the plan and demonstrated an understanding of the instructions.   The patient was advised to call back or seek an in-person evaluation if the symptoms  worsen or if the condition fails to improve as anticipated.   Marcille Buffy, FNP

## 2020-09-24 NOTE — Telephone Encounter (Signed)
Will wait for covid test result.

## 2020-09-25 NOTE — Telephone Encounter (Signed)
Noted, we will wait for those results, and she should not return to work until she has been fever free for 72 hours and all symptoms improving.  If covid can follow CDC guidelines for return to work, If she needs a note for being out currently please let me know we can send through [place].

## 2020-09-25 NOTE — Telephone Encounter (Addendum)
Left a message to call back and schedule for a lab visit at 3:15 for a covid test.

## 2020-09-25 NOTE — Telephone Encounter (Signed)
Called and spoke to [place]. Marie Solis declined coming in for a covid test as she has already had it done at [facility]. She was told that results will be back by 5pm.

## 2020-09-25 NOTE — Telephone Encounter (Addendum)
Called and spoke to Marie Solis, Marie Solis states that she will wait for the results and that she will let us know the results the morning of 09/26/2020

## 2020-09-25 NOTE — Telephone Encounter (Signed)
Routing to you °

## 2020-09-27 ENCOUNTER — Telehealth: Payer: Self-pay | Admitting: Family

## 2020-09-27 NOTE — Telephone Encounter (Signed)
noted 

## 2020-09-27 NOTE — Telephone Encounter (Signed)
PT called to inform that she got her covid test results back and they were negative.

## 2020-09-27 NOTE — Telephone Encounter (Signed)
error 

## 2020-09-30 ENCOUNTER — Telehealth: Payer: Self-pay | Admitting: Family

## 2020-09-30 NOTE — Telephone Encounter (Signed)
Noted and agree. 

## 2020-09-30 NOTE — Telephone Encounter (Signed)
I called patient & she stated that there was improvement. Today was last day of antibiotics & she said that mucus was becoming lighter. She just feels she still has some trapped in her chest. I advised UC if no improvement in the next few days since she hadn't quite finished antibiotics. I also advised mucinex with lots of fluids. Patient stated that she would try & see how that does the next few days. If not I advised either [facility] or [facility] so xray can be done if needed. She will update [place] on how she is feeling.

## 2020-09-30 NOTE — Telephone Encounter (Signed)
FYI Marie Solis   Call pt So sorry she is not feeling better and Im worried about her  I can see she has taken both prednisone and amoxicillin without improvement  For safety, would she be willing to go to [facility] for inperson evaluation which may include CXR?   I hesitate to start another antibiotic without a visit as this cause severe diarrhea, antibiotic resistance

## 2020-09-30 NOTE — Telephone Encounter (Signed)
Patient had VV with Sharyn Lull last week & was prescribed amoxicillin. She I advise patient give it more time. The only other thing I can offer is another VV with Sharyn Lull. We have no openings.

## 2020-09-30 NOTE — Telephone Encounter (Signed)
FYI Thanks for seeing her :)

## 2020-09-30 NOTE — Telephone Encounter (Signed)
PT called in to advise she is still not feeling well and is currently out of the antibiotics they were taking. Wants to know what to do as their still not feeling well.

## 2020-10-16 ENCOUNTER — Other Ambulatory Visit: Payer: Self-pay | Admitting: Family

## 2020-11-01 ENCOUNTER — Encounter: Payer: BC Managed Care – PPO | Admitting: Family

## 2020-11-01 ENCOUNTER — Other Ambulatory Visit: Payer: Self-pay | Admitting: Family

## 2020-11-01 DIAGNOSIS — J40 Bronchitis, not specified as acute or chronic: Secondary | ICD-10-CM

## 2020-11-01 DIAGNOSIS — J4531 Mild persistent asthma with (acute) exacerbation: Secondary | ICD-10-CM

## 2020-11-04 ENCOUNTER — Telehealth: Payer: Self-pay | Admitting: Family

## 2020-11-04 ENCOUNTER — Telehealth (INDEPENDENT_AMBULATORY_CARE_PROVIDER_SITE_OTHER): Payer: BC Managed Care – PPO | Admitting: Family

## 2020-11-04 DIAGNOSIS — J4531 Mild persistent asthma with (acute) exacerbation: Secondary | ICD-10-CM

## 2020-11-04 DIAGNOSIS — J40 Bronchitis, not specified as acute or chronic: Secondary | ICD-10-CM

## 2020-11-04 MED ORDER — SYMBICORT 160-4.5 MCG/ACT IN AERO: 2.0000 | INHALATION_SPRAY | Freq: Two times a day (BID) | RESPIRATORY_TRACT | 3 refills | Status: AC

## 2020-11-04 MED ORDER — BENZONATATE 100 MG PO CAPS: 100.0000 mg | ORAL_CAPSULE | Freq: Three times a day (TID) | ORAL | 1 refills | Status: AC | PRN

## 2020-11-04 NOTE — Patient Instructions (Addendum)
Please use symbicort daily  Check to ensure mucinex is PLAIN mucinex and not mucinex D  Start tessalon   Call with covid results and any new concerns   If positive, we will start Molnupiravir  We discussed starting Molnupiravir which is an unapproved drug that is authorized for use under an Emergency Use Authorization.  There are no adequate, approved, available products for the treatment of COVID-19 in adults who have mild-to-moderate COVID-19 and are at high risk for progressing to severe COVID-19, including hospitalization or death.  I have sent  Molnupiravir to your pharmacy. Please call pharmacy so they bring medication out to your car and you do not have to go inside.   This medication is not recommended in pregnancy.    COMMON SIDE EFFECTS: Diarrhea Nausea dizziness   If your COVID-19 symptoms get worse, get medical help right away. Call 911 if you experience symptoms such as worsening cough, trouble breathing, chest pain that doesn't go away, confusion, a hard time staying awake, and pale or blue-colored skin.This medication won't prevent all COVID-19 cases from getting worse.   Molnupiravir Oral Capsules What is this medication? MOLNUPIRAVIR (mol nue pir a vir) treats COVID-19. It is an antiviral medication. It may decrease the risk of developing severe symptoms of COVID-19. It may also decrease the chance of going to the hospital. This medication is not approved by the FDA. The FDA has authorized emergency use of thismedication during the COVID-19 pandemic. This medicine may be used for other purposes; ask your health care provider orpharmacist if you have questions. What should I tell my care team before I take this medication? They need to know if you have any of these conditions: Any allergies Any serious illness An unusual or allergic reaction to molnupiravir, other medications, foods, dyes, or preservatives Pregnant or trying to get pregnant Breast-feeding How should  I use this medication? Take this medication by mouth with water. Take it as directed on the prescription label at the same time every day. Do not cut, crush or chew this medication. Swallow the capsules whole. You can take it with or without food. If it upsets your stomach, take it with food. Take all of this medication unless your care team tells you to stop it early. Keep taking it even if youthink you are better. Talk to your care team about the use of this medication in children. Specialcare may be needed. Overdosage: If you think you have taken too much of this medicine contact apoison control center or emergency room at once. NOTE: This medicine is only for you. Do not share this medicine with others. What if I miss a dose? If you miss a dose, take it as soon as you can unless it is more than 10 hours late. If it is more than 10 hours late, skip the missed dose. Take the next dose at the normal time. Do not take extra or 2 doses at the same time to makeup for the missed dose. What may interact with this medication? Interactions have not been studied. This list may not describe all possible interactions. Give your health care provider a list of all the medicines, herbs, non-prescription drugs, or dietary supplements you use. Also tell them if you smoke, drink alcohol, or use illegaldrugs. Some items may interact with your medicine. What should I watch for while using this medication? Your condition will be monitored carefully while you are receiving this medication. Visit your care team for regular checkups. Tell your care  team ifyour symptoms do not start to get better or if they get worse. Do not become pregnant while taking this medication. You may need a pregnancy test before starting this medication. Women must use a reliable form of birth control while taking this medication and for 4 days after stopping the medication. Women should inform their care team if they wish to become pregnant or  think they might be pregnant. Men should not father a child while taking this medication and for 3 months after stopping it. There is potential for serious harm to an unborn child. Talk to your care team for more information. Do not breast-feed an infant while taking this medication and for 4 days afterstopping the medication. What side effects may I notice from receiving this medication? Side effects that you should report to your care team as soon as possible: Allergic reactions-skin rash, itching, hives, swelling of the face, lips, tongue, or throat Side effects that usually do not require medical attention (report these toyour care team if they continue or are bothersome): Diarrhea Dizziness Nausea This list may not describe all possible side effects. Call your doctor for medical advice about side effects. You may report side effects to FDA at1-800-FDA-1088. Where should I keep my medication? Keep out of the reach of children and pets. Store at room temperature between 20 and 25 degrees C (68 and 77 degrees F).Get rid of any unused medication after the expiration date. To get rid of medications that are no longer needed or have expired: Take the medication to a medication take-back program. Check with your pharmacy or law enforcement to find a location. If you cannot return the medication, check the label or package insert to see if the medication should be thrown out in the garbage or flushed down the toilet. If you are not sure, ask your care team. If it is safe to put it in the trash, take the medication out of the container. Mix the medication with cat litter, dirt, coffee grounds, or other unwanted substance. Seal the mixture in a bag or container. Put it in the trash. NOTE: This sheet is a summary. It may not cover all possible information. If you have questions about this medicine, talk to your doctor, pharmacist, orhealth care provider.  2022 Elsevier/Gold Standard (2020-05-20  16:16:01)

## 2020-11-04 NOTE — Telephone Encounter (Signed)
PT called to advise that she had a covid test and it was negative.

## 2020-11-04 NOTE — Assessment & Plan Note (Addendum)
Patient well appearing and no acute respiratory distress. Paxlovid contraindicated with Symbicort ( LABA) Discussed molnupiravir. Counseled on lacking long term safely and effectiveness data of medication,molnupiravir.  Explained EUA for molnupiravir. Criteria met for consideration of  Molnupiravir with exception of covid status ( awaiting PCR) :  patient older than 56 years old, started within 5 days of symptom onset and risk factor for severe disease include: obesity, asthma, htn, OSA.  Counseled on this medication is not safe in pregnancy  Vaccine status: fully vaccinated with booster.  Counseled on adverse effects including nausea, dizziness, diarrhea.  Patient is most comfortable and desires to start Molnupiravir if covid postive and understands to call me with concerns or new symptoms.  Will treat conservatively with tessalon, plain mucinex, inhalers as prescribed and patient will let me know how she is doing.

## 2020-11-04 NOTE — Progress Notes (Signed)
Virtual Visit via Video Note  I connected with@  on 11/04/20 at  2:00 PM EDT by a video enabled telemedicine application and verified that I am speaking with the correct person using two identifiers.  Location patient: home Location provider:work  Persons participating in the virtual visit: patient, provider  I discussed the limitations of evaluation and management by telemedicine and the availability of in person appointments. The patient expressed understanding and agreed to proceed.   HPI:  Productive cough  x 3 days, worsening.  Cough is worse at night.   Endorses tactile warmth, intermittent wheezing. Wheezing resolved with albuterol inhaler. Compliant with singulair. She has run of symbicort 2 days ago.   No chills, nausea , vomiting, facia pain, facial swelling, sore throat, HA, ear pain.   She is taking mucinex, tylenol cold & flu with temporary relief.   No known covid exposure. She is covid vaccines and booster.   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   Treated with augmentin one month ago, with complete resolution.   No ckd   ROS: See pertinent positives and negatives per HPI.    EXAM:  VITALS per patient if applicable: Ht 5' 5"  (1.651 m)   Wt (!) 305 lb (138.3 kg)   BMI 50.75 kg/m  BP Readings from Last 3 Encounters:  07/29/20 116/80  03/21/20 123/74  03/07/20 137/78   Wt Readings from Last 3 Encounters:  11/04/20 (!) 305 lb (138.3 kg)  09/24/20 290 lb (131.5 kg)  07/29/20 293 lb 12.8 oz (133.3 kg)    GENERAL: alert, oriented, appears well and in no acute distress  HEENT: atraumatic, conjunttiva clear, no obvious abnormalities on inspection of external nose and ears  NECK: normal movements of the head and neck  LUNGS: on inspection no signs of respiratory distress, breathing rate appears normal, no obvious gross SOB, gasping or wheezing  CV: no obvious cyanosis  MS: moves all  visible extremities without noticeable abnormality  PSYCH/NEURO: pleasant and cooperative, no obvious depression or anxiety, speech and thought processing grossly intact  ASSESSMENT AND PLAN:  Discussed the following assessment and plan:  Problem List Items Addressed This Visit       Respiratory   Bronchitis    Patient well appearing and no acute respiratory distress. Paxlovid contraindicated with Symbicort ( LABA) Discussed molnupiravir. Counseled on lacking long term safely and effectiveness data of medication,molnupiravir.  Explained EUA for molnupiravir. Criteria met for consideration of  Molnupiravir with exception of covid status ( awaiting PCR) :  patient older than 56 years old, started within 5 days of symptom onset and risk factor for severe disease include: obesity, asthma, htn, OSA.  Counseled on this medication is not safe in pregnancy  Vaccine status: fully vaccinated with booster.  Counseled on adverse effects including nausea, dizziness, diarrhea.  Patient is most comfortable and desires to start Molnupiravir if covid postive and understands to call me with concerns or new symptoms.  Will treat conservatively with tessalon, plain mucinex, inhalers as prescribed and patient will let me know how she is doing.          Relevant Medications   SYMBICORT 160-4.5 MCG/ACT inhaler   benzonatate (TESSALON) 100 MG capsule   Other Visit Diagnoses     Mild persistent asthma with acute exacerbation       Relevant Medications   SYMBICORT 160-4.5 MCG/ACT inhaler   benzonatate (TESSALON) 100 MG capsule       -  we discussed possible serious and likely etiologies, options for evaluation and workup, limitations of telemedicine visit vs in person visit, treatment, treatment risks and precautions. Pt prefers to treat via telemedicine empirically rather then risking or undertaking an in person visit at this moment.  .   I discussed the assessment and treatment plan with the  patient. The patient was provided an opportunity to ask questions and all were answered. The patient agreed with the plan and demonstrated an understanding of the instructions.   The patient was advised to call back or seek an in-person evaluation if the symptoms worsen or if the condition fails to improve as anticipated.   [name], FNP

## 2020-11-05 ENCOUNTER — Telehealth: Payer: Self-pay | Admitting: Family

## 2020-11-05 DIAGNOSIS — J40 Bronchitis, not specified as acute or chronic: Secondary | ICD-10-CM

## 2020-11-05 MED ORDER — AZITHROMYCIN 250 MG PO TABS: ORAL_TABLET | ORAL | 0 refills | Status: AC

## 2020-11-05 NOTE — Telephone Encounter (Signed)
Patient had a virtual yesterday with Arnett. She states she feels worse, tested negative for covid. Her symtoms are: low grade fever, coughing green, chest congestion. She would like something for it.

## 2020-11-05 NOTE — Telephone Encounter (Signed)
Pt called and notified of below. Ot verbalized understanding of this.

## 2020-11-05 NOTE — Telephone Encounter (Signed)
Please advise 

## 2020-11-05 NOTE — Telephone Encounter (Signed)
Call pt I have sent in zpak Ensure to take probiotics while on antibiotics and also for 2 weeks after completion. This can either be by eating yogurt daily or taking a probiotic supplement over the counter such as Culturelle.It is important to re-colonize the gut with good bacteria and also to prevent any diarrheal infections associated with antibiotic use.   If symptoms continue to progress , she would need to return to UC for re evaluation in person

## 2020-11-05 NOTE — Telephone Encounter (Signed)
FYI

## 2020-11-05 NOTE — Addendum Note (Signed)
Addended by: Burnard Hawthorne on: 11/05/2020 01:37 PM   Modules accepted: Orders

## 2020-12-05 ENCOUNTER — Telehealth: Payer: Self-pay | Admitting: Family

## 2020-12-05 NOTE — Telephone Encounter (Signed)
Lft pt vm to call ofc to follow up on referral. thanks

## 2021-03-26 IMAGING — DX LEFT HAND - COMPLETE 3+ VIEW
3 series · 3 of 3 positions shown · non-contrast
Comparison: None.

CLINICAL DATA: Fall, left hand pain and swelling

EXAM:
LEFT HAND - COMPLETE 3+ VIEW

[hand pa]
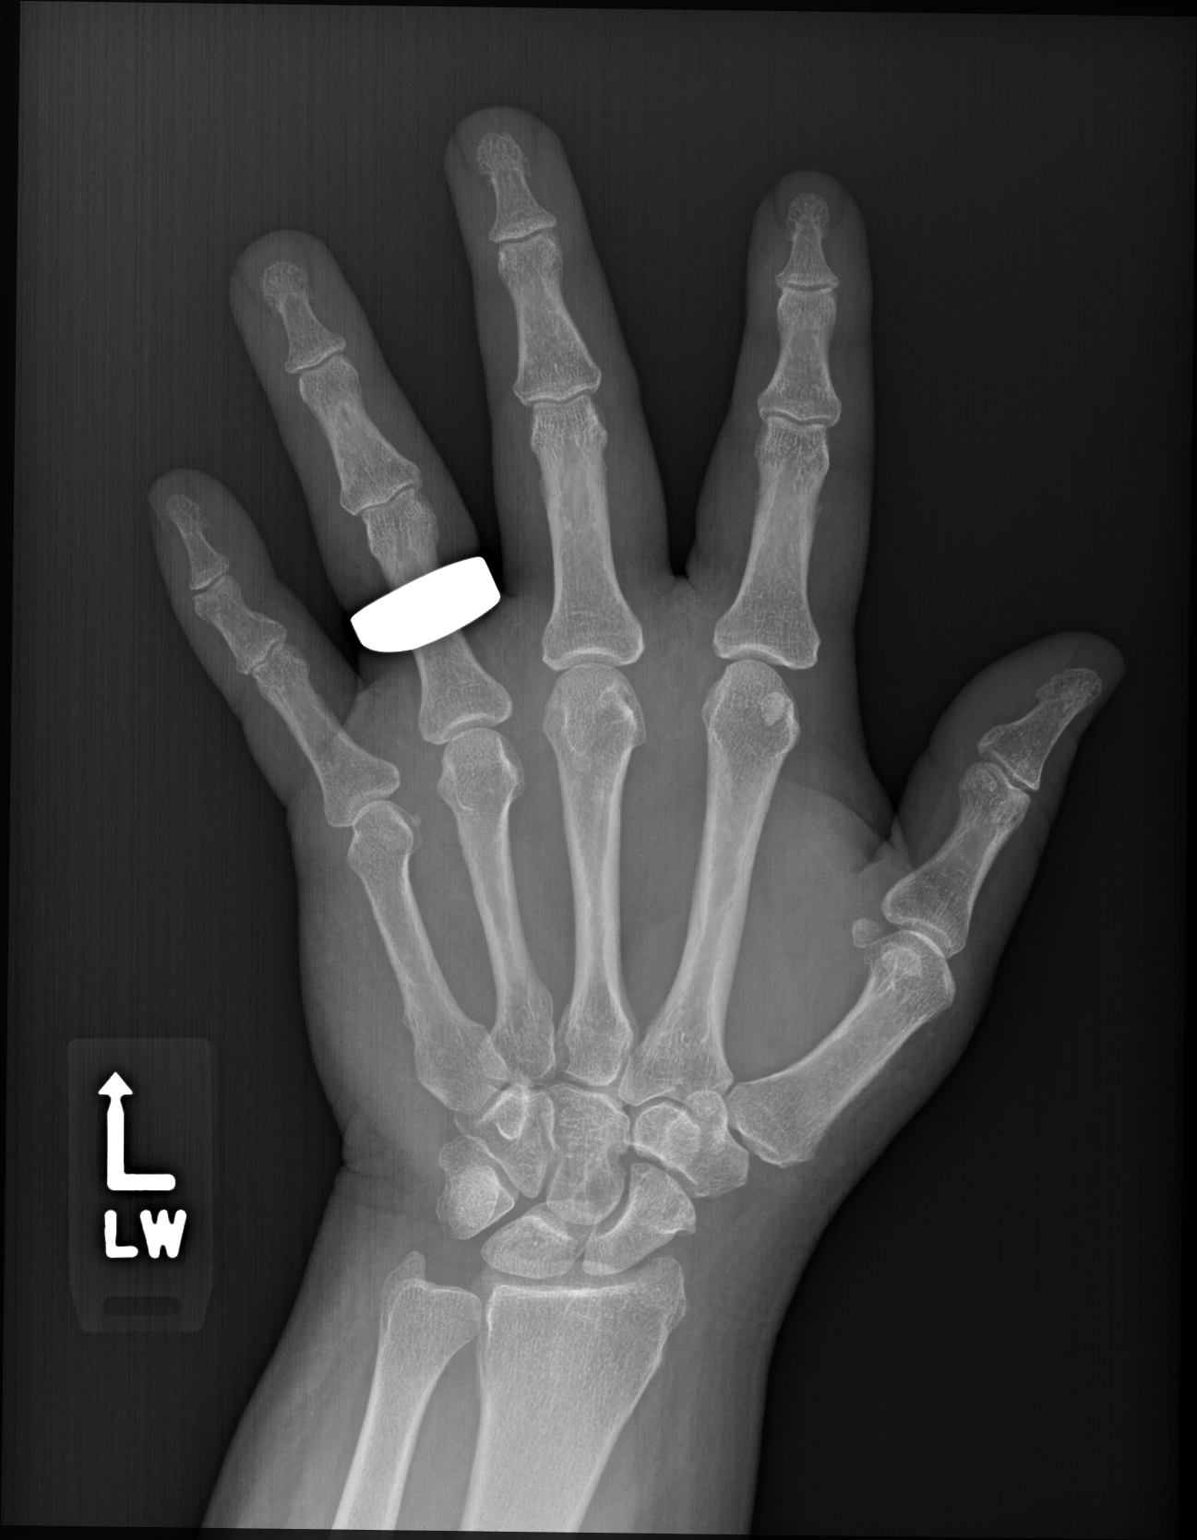

[hand obl (oblique)]
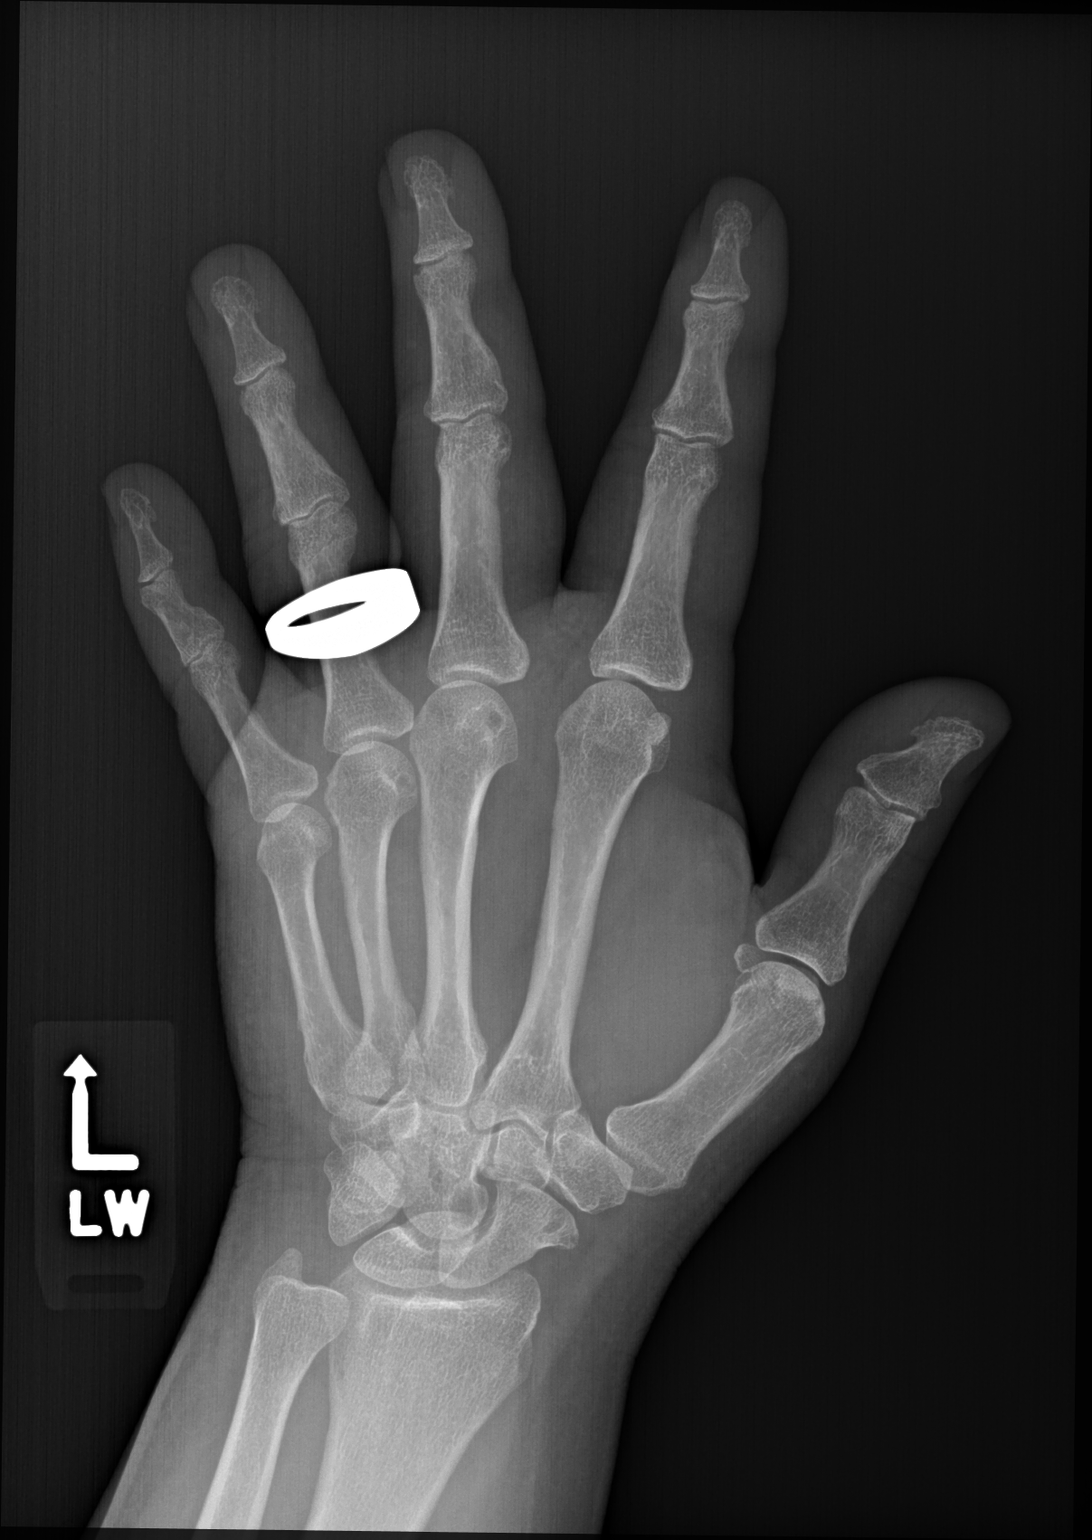

[hand lat]
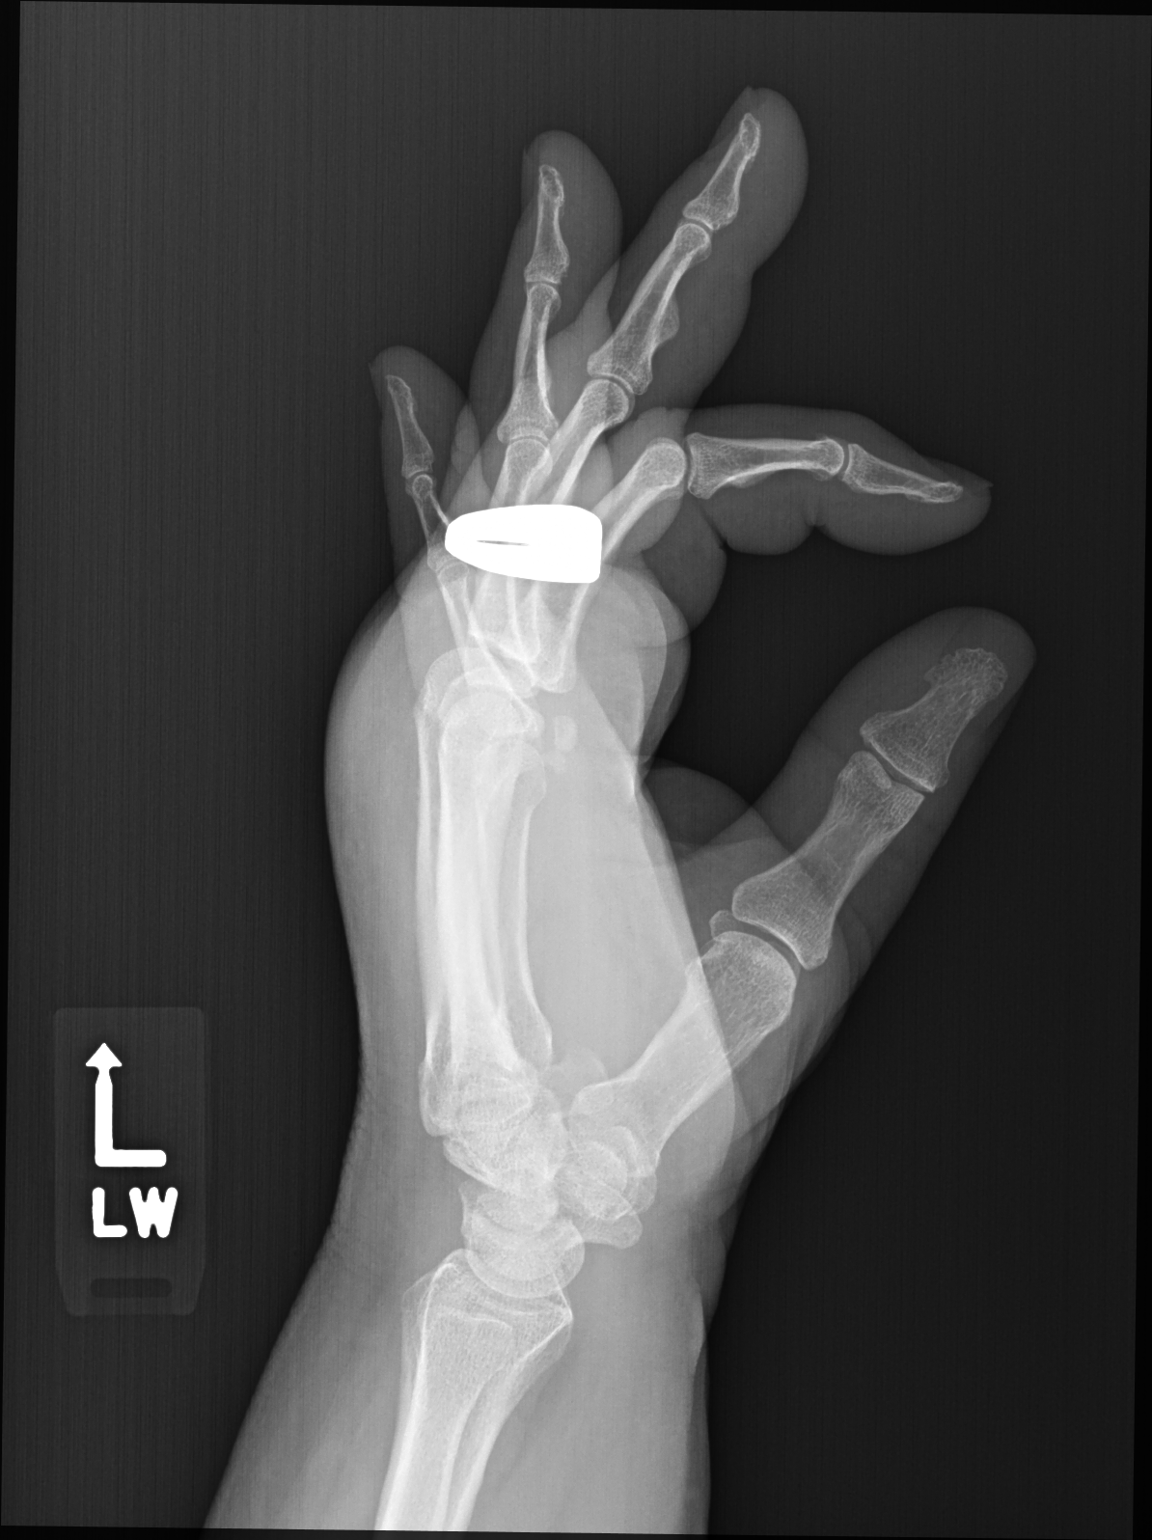

[3 of 3 positions shown; findings below may reference images not displayed]

FINDINGS: No fracture or dislocation. Dorsal soft tissue swelling is seen.
Normal bone mineralization seen throughout.
IMPRESSION: No acute osseous injury

## 2021-11-14 ENCOUNTER — Telehealth: Payer: Self-pay

## 2021-12-31 ENCOUNTER — Encounter (INDEPENDENT_AMBULATORY_CARE_PROVIDER_SITE_OTHER): Payer: Self-pay
